# Patient Record
Sex: Male | Born: 1965 | Race: White | Hispanic: No | Marital: Married | Smoking: Current every day smoker
Health system: Southern US, Community
[De-identification: ages and names within clinical notes are randomized; demographics above are authoritative.]

## PROBLEM LIST (undated history)

## (undated) DIAGNOSIS — G4733 Obstructive sleep apnea (adult) (pediatric): Secondary | ICD-10-CM

## (undated) DIAGNOSIS — J449 Chronic obstructive pulmonary disease, unspecified: Secondary | ICD-10-CM

## (undated) DIAGNOSIS — G5600 Carpal tunnel syndrome, unspecified upper limb: Secondary | ICD-10-CM

## (undated) DIAGNOSIS — K219 Gastro-esophageal reflux disease without esophagitis: Secondary | ICD-10-CM

## (undated) DIAGNOSIS — F319 Bipolar disorder, unspecified: Secondary | ICD-10-CM

## (undated) DIAGNOSIS — M1990 Unspecified osteoarthritis, unspecified site: Secondary | ICD-10-CM

## (undated) DIAGNOSIS — I10 Essential (primary) hypertension: Secondary | ICD-10-CM

## (undated) DIAGNOSIS — F32A Depression, unspecified: Secondary | ICD-10-CM

## (undated) DIAGNOSIS — F419 Anxiety disorder, unspecified: Secondary | ICD-10-CM

## (undated) DIAGNOSIS — I2510 Atherosclerotic heart disease of native coronary artery without angina pectoris: Secondary | ICD-10-CM

## (undated) DIAGNOSIS — G8929 Other chronic pain: Secondary | ICD-10-CM

## (undated) HISTORY — DX: Obstructive sleep apnea (adult) (pediatric): G47.33

## (undated) HISTORY — PX: SHOULDER ARTHROSCOPY: SHX128

## (undated) HISTORY — DX: Atherosclerotic heart disease of native coronary artery without angina pectoris: I25.10

## (undated) HISTORY — DX: Anxiety disorder, unspecified: F41.9

## (undated) HISTORY — DX: Carpal tunnel syndrome, unspecified upper limb: G56.00

## (undated) HISTORY — PX: ANTERIOR FUSION CERVICAL SPINE: SUR626

## (undated) HISTORY — PX: BACK SURGERY: SHX140

## (undated) HISTORY — DX: Other chronic pain: G89.29

## (undated) HISTORY — PX: COLONOSCOPY W/ POLYPECTOMY: SHX1380

## (undated) HISTORY — PX: CARPAL TUNNEL RELEASE: SHX101

---

## 2006-06-11 ENCOUNTER — Ambulatory Visit (HOSPITAL_COMMUNITY): Admission: RE | Admit: 2006-06-11 | Discharge: 2006-06-12 | Payer: Self-pay | Admitting: Neurosurgery

## 2006-09-24 ENCOUNTER — Ambulatory Visit (HOSPITAL_COMMUNITY): Admission: RE | Admit: 2006-09-24 | Discharge: 2006-09-25 | Payer: Self-pay | Admitting: Neurosurgery

## 2007-08-15 ENCOUNTER — Encounter
Admission: RE | Admit: 2007-08-15 | Discharge: 2007-10-16 | Payer: Self-pay | Admitting: Physical Medicine & Rehabilitation

## 2007-08-16 ENCOUNTER — Ambulatory Visit: Payer: Self-pay | Admitting: Physical Medicine & Rehabilitation

## 2007-09-13 ENCOUNTER — Ambulatory Visit: Payer: Self-pay | Admitting: Physical Medicine & Rehabilitation

## 2007-10-16 ENCOUNTER — Ambulatory Visit: Payer: Self-pay | Admitting: Physical Medicine & Rehabilitation

## 2007-11-11 ENCOUNTER — Encounter
Admission: RE | Admit: 2007-11-11 | Discharge: 2008-02-09 | Payer: Self-pay | Admitting: Physical Medicine & Rehabilitation

## 2007-11-13 ENCOUNTER — Ambulatory Visit: Payer: Self-pay | Admitting: Physical Medicine & Rehabilitation

## 2007-12-10 ENCOUNTER — Ambulatory Visit: Payer: Self-pay | Admitting: Physical Medicine & Rehabilitation

## 2008-01-07 ENCOUNTER — Ambulatory Visit: Payer: Self-pay | Admitting: Physical Medicine & Rehabilitation

## 2008-02-06 ENCOUNTER — Encounter
Admission: RE | Admit: 2008-02-06 | Discharge: 2008-05-06 | Payer: Self-pay | Admitting: Physical Medicine & Rehabilitation

## 2008-02-07 ENCOUNTER — Ambulatory Visit: Payer: Self-pay | Admitting: Physical Medicine & Rehabilitation

## 2008-03-10 ENCOUNTER — Ambulatory Visit: Payer: Self-pay | Admitting: Physical Medicine & Rehabilitation

## 2008-04-10 ENCOUNTER — Ambulatory Visit: Payer: Self-pay | Admitting: Physical Medicine & Rehabilitation

## 2008-05-14 ENCOUNTER — Encounter: Admission: RE | Admit: 2008-05-14 | Discharge: 2008-05-18 | Payer: Self-pay | Admitting: Urology

## 2008-05-18 ENCOUNTER — Ambulatory Visit: Payer: Self-pay | Admitting: Physical Medicine & Rehabilitation

## 2008-06-11 IMAGING — CR DG CHEST 2V
2 series · 2 of 2 positions shown · non-contrast
Comparison: none

CLINICAL DATA: Preoperative respiratory exam for disc surgery. 
 CHEST - 2 VIEW:

[view not recorded (1 of 2)]
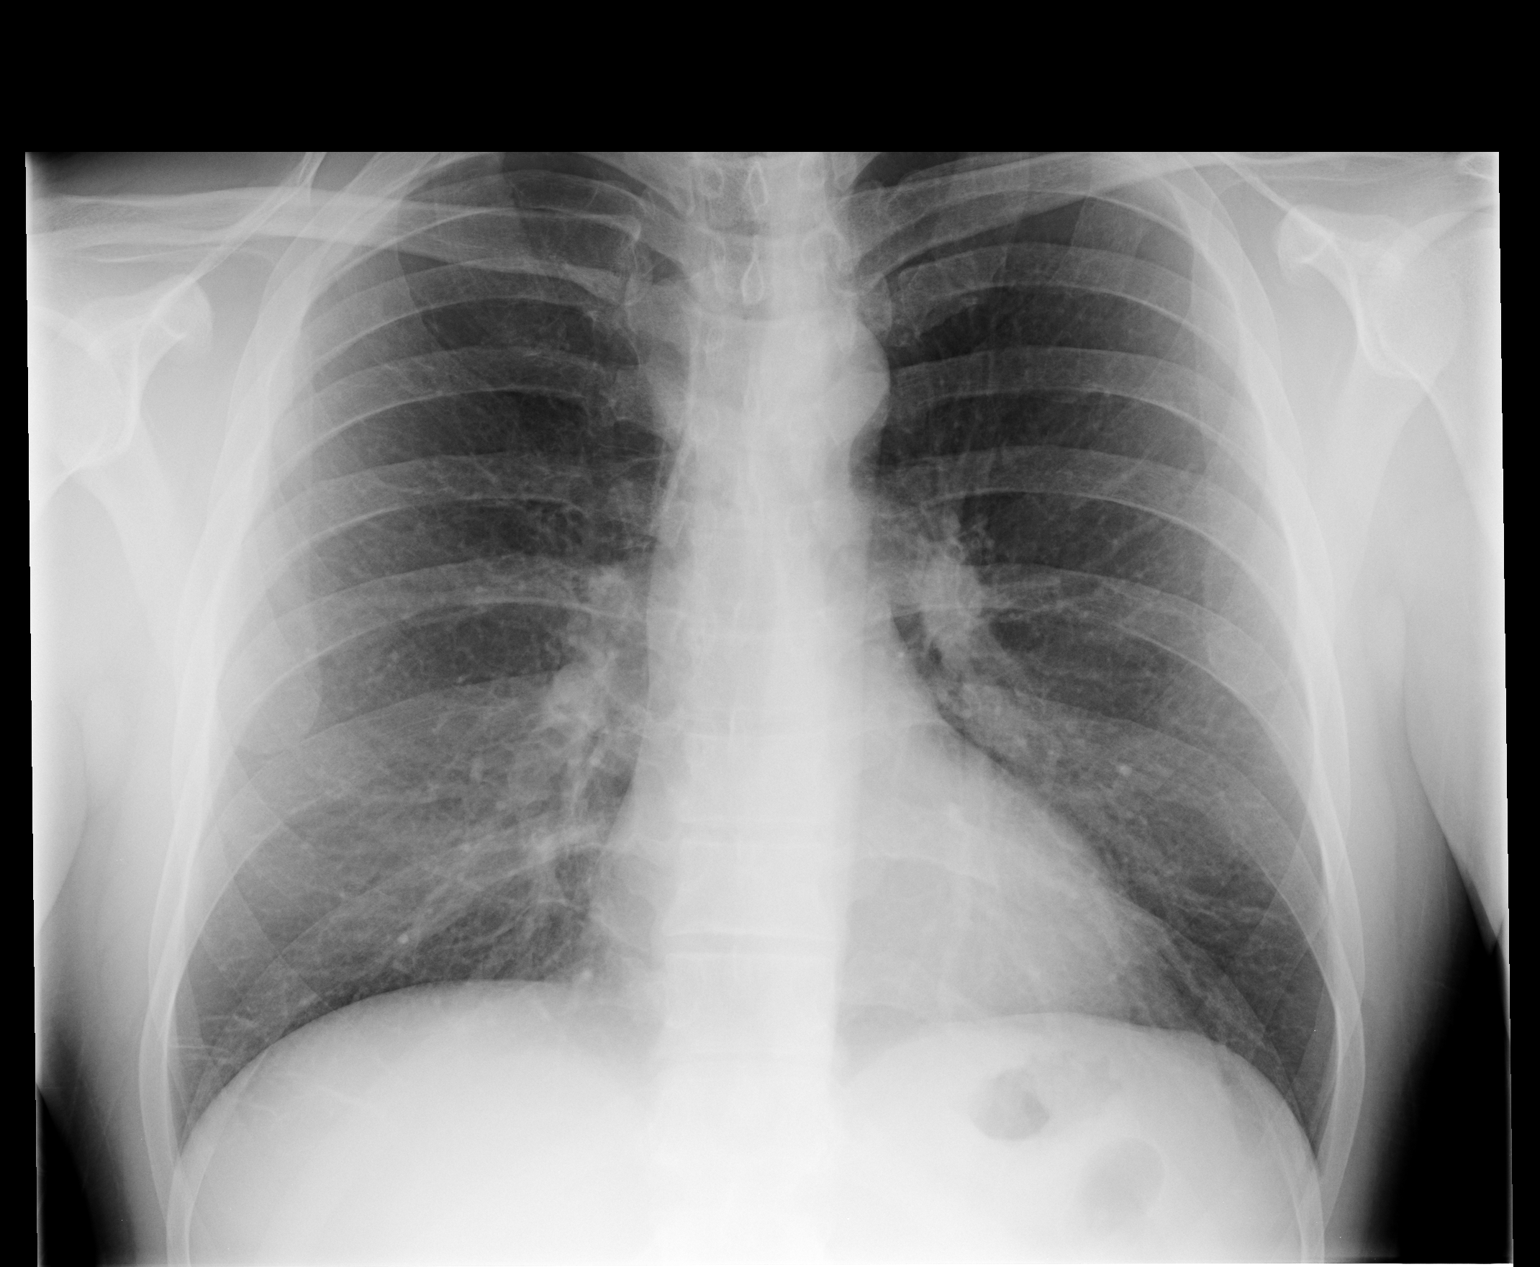

[view not recorded (2 of 2)]
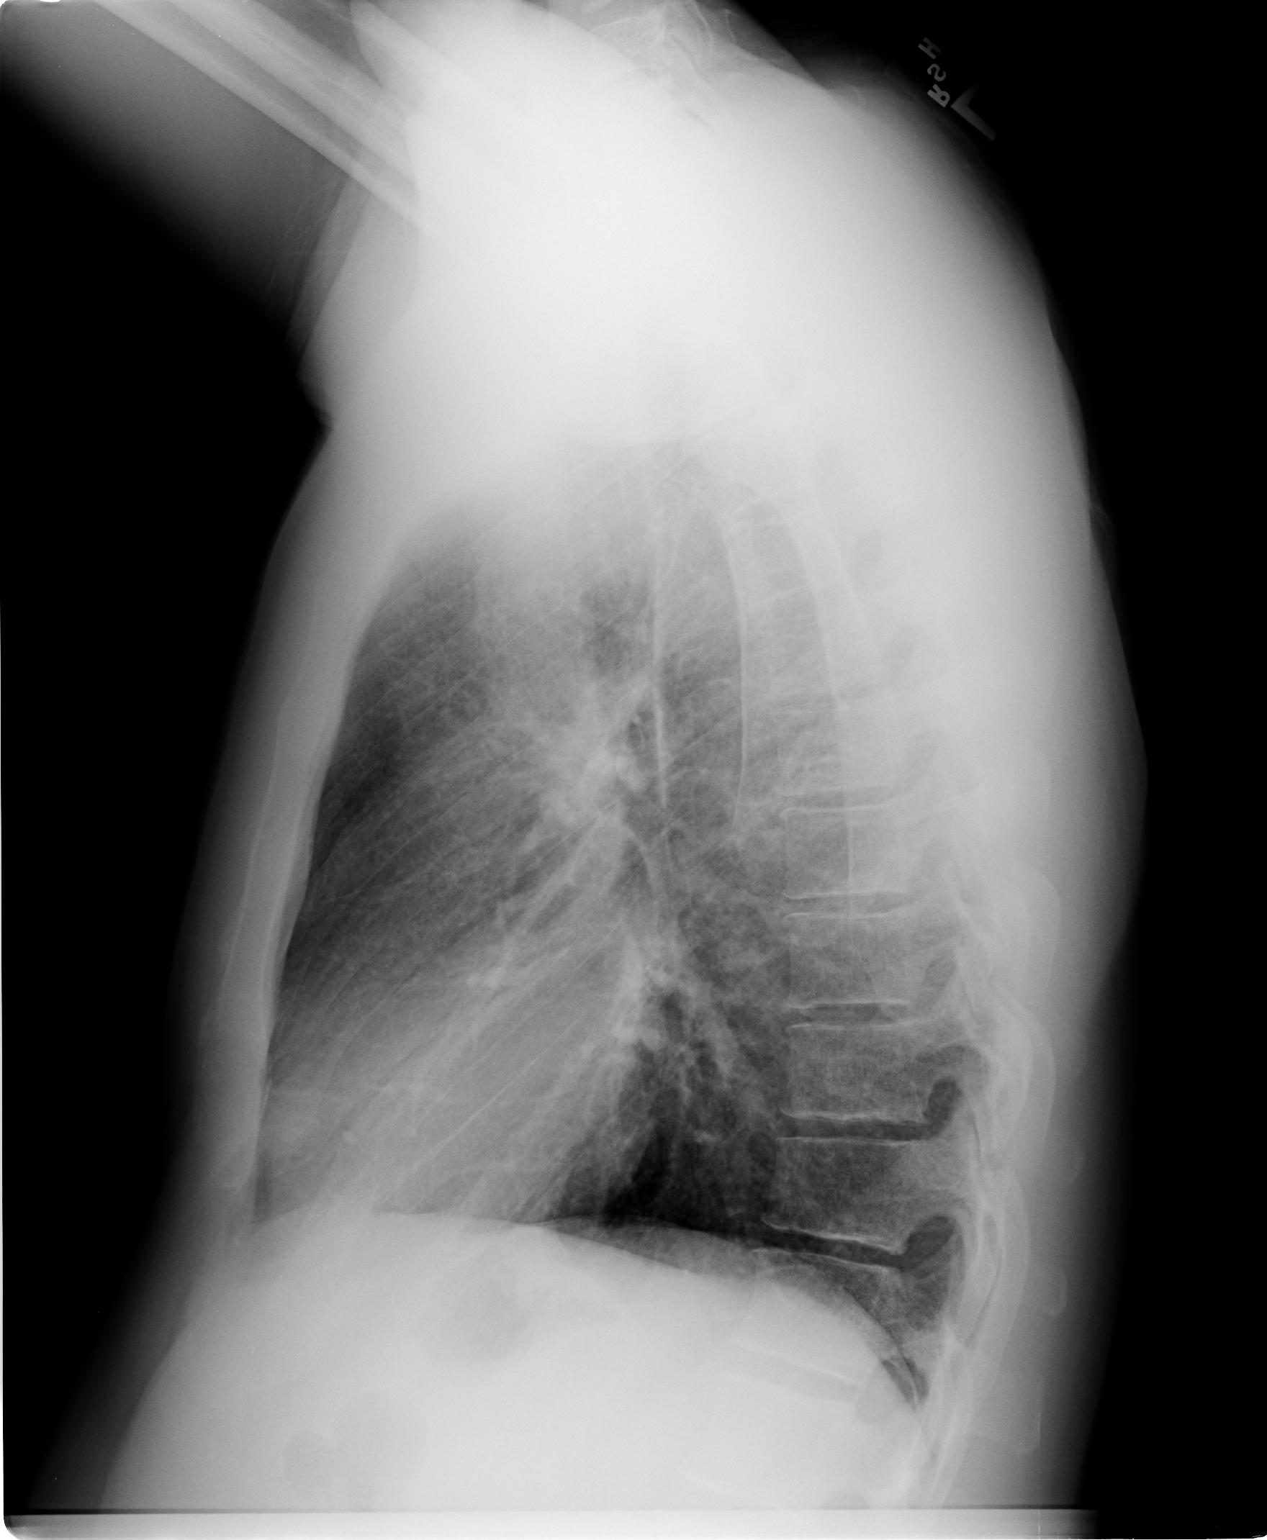

[2 of 2 positions shown; findings below may reference images not displayed]

FINDINGS: The heart and mediastinum are normal. There is mild scarring at the lung bases.  No evidence of active infiltrate, mass, effusion, or collapse.
IMPRESSION: Chronic lung markings without evidence of acute process.

## 2008-09-28 IMAGING — CR DG CERVICAL SPINE 2 OR 3 VIEWS
1 series · 1 of 1 positions shown · non-contrast
Comparison: none

CLINICAL DATA: 40 year old with cervical disk herniation.
 CERVICAL SPINE ? 2 VIEWS ? 09/24/06: 
 Two lateral cervical spine films from the operating room.

[view not recorded]
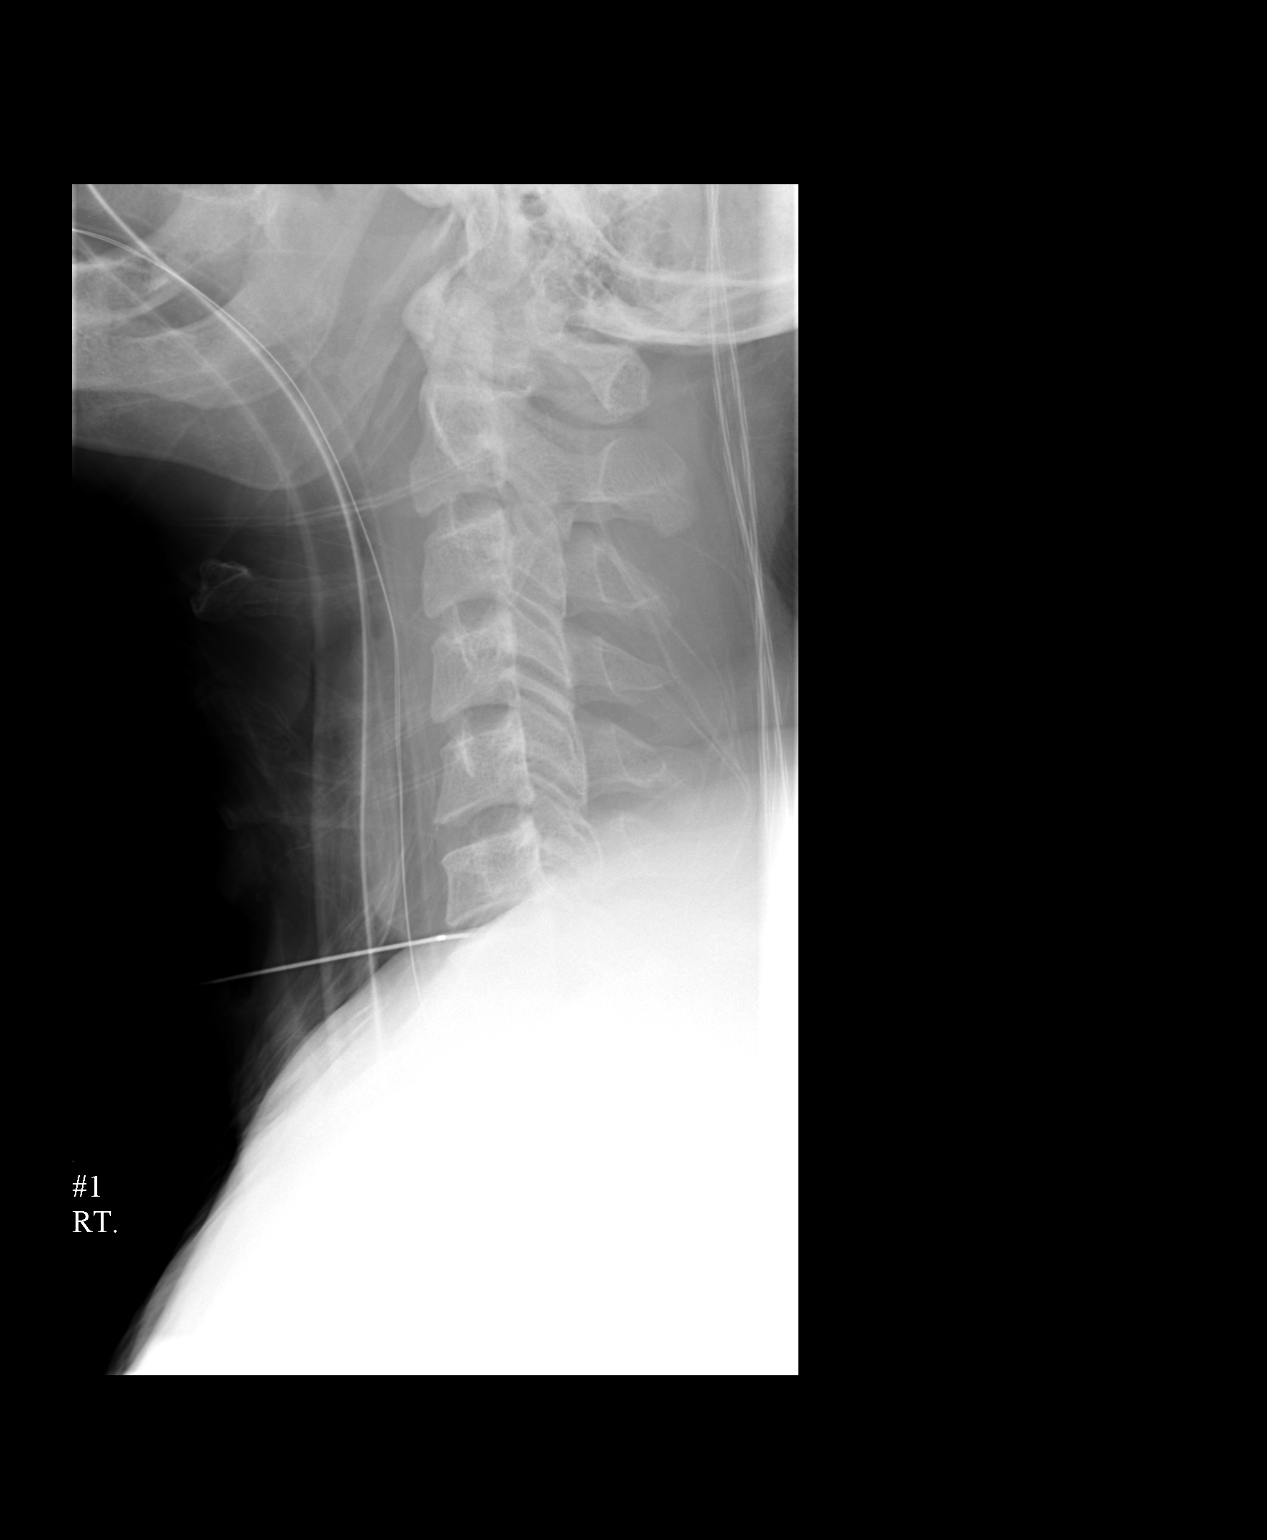

[1 of 1 positions shown; findings below may reference images not displayed]

FINDINGS: The first film demonstrates a spinal needle projecting at the C6-7 interspace.  The second film shows anterior plate and screws and an interbody bone plug fusing C6-7.  Normal alignment.  No complicating features.
IMPRESSION: C6-7 fusion.

## 2009-01-07 DIAGNOSIS — M961 Postlaminectomy syndrome, not elsewhere classified: Secondary | ICD-10-CM | POA: Insufficient documentation

## 2009-01-07 HISTORY — DX: Postlaminectomy syndrome, not elsewhere classified: M96.1

## 2009-02-10 DIAGNOSIS — M5126 Other intervertebral disc displacement, lumbar region: Secondary | ICD-10-CM

## 2009-02-10 DIAGNOSIS — M5412 Radiculopathy, cervical region: Secondary | ICD-10-CM

## 2009-02-10 HISTORY — DX: Other intervertebral disc displacement, lumbar region: M51.26

## 2009-02-10 HISTORY — DX: Radiculopathy, cervical region: M54.12

## 2009-06-03 DIAGNOSIS — R519 Headache, unspecified: Secondary | ICD-10-CM | POA: Insufficient documentation

## 2009-06-03 HISTORY — DX: Headache, unspecified: R51.9

## 2010-06-14 NOTE — Assessment & Plan Note (Signed)
Jimmy Morrison is back regarding his chronic cervicalgia and arm pain.  His pain  is 10/10.  We restarted Keppra and began Celebrex as well with Voltaren  gel.  He has had minimal results with these.  He is having a lot of  numbness and tingling in his hand along the thumb, index, and sometimes  in his middle finger.  I reviewed his MRI again today and it shows  stable postoperative site at C6-C7.  He does have a bulge at C5-C6.  There is no indication of any nerve root involvement on the scan.  EMG  reveals mild left C8 radiculopathy.  The patient states the pain is  constant and tingling.  He finds that the arm/hand goes numb when he is  sleeping, resting sometimes on the sides, sometimes even when he is on  his back.  Sleep overall is poor, going from 2-5 hours at night.  Elavil  helps him get to sleep but he cannot stay asleep.  Pain interferes with  general activity, relation with others, enjoyment of life on a moderate  to severe level.   REVIEW OF SYSTEMS:  Notable for numbness, tingling, spasms, depression,  history of suicidal thoughts, and night sweats.   SOCIAL HISTORY:  The patient is married.  No new issues are noted today.  He is living with his wife.  He states that he has been clean over the  last 2 months.  His urine drug screen was clean from September.   PHYSICAL EXAMINATION:  VITAL SIGNS:  Blood pressure is 129/82, pulse 84,  respiratory rate is 16, and he is sating 98% on room air.  GENERAL:  The patient is pleasant, alert and oriented x3.  He has had  positive Tinel sign today at the wrist, he had previously at the ulnar  grooves.  Spurling test was negative.  He had some tenderness of the  right shoulder with impingement maneuvers today that duplicated his  shoulder pain.  NECK:  Mildly tender to palpation in range.  Strength is generally 5/5  throughout except the hands which may have been 4+/5 with intrinsic  muscle use.  Reflexes are 1+ to 2+ throughout  inconsistently.  The  patient has mild tenderness with the L5-S1 level on palpation and with  reflexion today.  Cognitively, he is appropriate.  HEART:  Regular.  CHEST:  Clear.  ABDOMEN:  Soft and nontender.   ASSESSMENT:  1. Cervicalgia related to post laminectomy syndrome.  2. 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  3. Continued hand symptoms more on the C6-C7/median nerve distribution      of the hands.  I see no obvious ulnar nerve signs today which were      there in August.  MRI and nerve conduction study/EMG do not      corroborate with exam.  4. Depression.  5. History of cocaine abuse.   PLAN:  1. After informed consent, we injected the right shoulder via lateral      approach with 40 mg Kenalog at 3 mL, 1% lidocaine.  The patient      tolerated well.  Observed for relief of any of his right upper      extremity symptoms.  2. We will schedule another EMG nerve conduction study to assess for      carpal tunnel syndrome and C6-C7 radiculopathy.  3. Increase Keppra to 500 mg b.i.d.  4. We will add Ultram cautiously 50 mg q.i.d.  The patient will be  under close scrutiny regarding his narcotic      agreement and use of his medications.  5. I will see him back pending scheduling of the study above.      Ranelle Oyster, M.D.  Electronically Signed     ZTS/MedQ  D:  11/13/2007 10:39:36  T:  11/14/2007 02:17:12  Job #:  [id number]   cc:   Dyke Brackett, M.D.  Fax: [phone]

## 2010-06-14 NOTE — Assessment & Plan Note (Signed)
Jimmy Morrison is back regarding his chronic neck and shoulder pain.  His  anterior shoulder pain improved after the biceps injections, but still  he is having some intra-articular pain.  He complains of some tingling  down the right arm at times as well.  He rates his pain 9/10.  He  stopped the Topamax per his family physician's recommendations over with  concerns of seizures.  He had had some tingling in the ear and right  head that prompted this change.  He remains on Keppra 1000 mg b.i.d.  with Cymbalta 60 mg b.i.d.  Sleep is poor.  He has had some anxiety.   REVIEW OF SYSTEMS:  Notable for weakness, numbness, depression, spasms,  dizziness, night sweats, and poor appetite.  Full review is in the  written health and history section in the chart.   SOCIAL HISTORY:  The patient is married and no significant change is  noted.   PHYSICAL EXAMINATION:  VITAL SIGNS:  Blood pressure 113/56, pulse is 84,  and respiratory rate is 15.  He is sating 97% on room air.  GENERAL:  The patient is pleasant, alert and oriented x3.  EXTREMITIES:  He had the pain with rotator cuff impingement signs today.  Biceps tendons were minimally tender.  His strength 5/5 with 1+ reflexes  and normal sensation in both arms. NECK:  Painful with flexion more than  extension today and pain over the upper third of the trapezius with  palpation and reproduced back pain.  He had some discomfort with  rotation in either side as well.  Strength was 5/5 in both legs with  tight hamstring, is left greater than right.  He did a slump test.  His  straight leg raising was negative.  Cognitively, he is intact.  His mood  was euthymic.  CHEST:  Clear.  HEART:  Regular.  ABDOMEN:  Soft, nontender.   ASSESSMENT:  1. Cervicalgia related to post laminectomy syndrome.  2. 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  3. Radiating pain in the arms.  No clear evidence of new      radiculopathy, although he  has signs of chronic C8-T1 radiculopathy      in both nerve conduction/EMG studies.  4. Depression.  5. History of cocaine abuse.   PLAN:  1. After informed consent, we injected both shoulders via lateral      approach of 40 mg Kenalog and 3 mL of 1% lidocaine.  The patient      tolerated well.  2. Began low-dose MS Contin 15 mg q.12 h. with Vicodin 5/500 q.12 h.      p.r.n..  He is able to be on short release regarding his      medications and he has no room for air as far as we are concerned.      He is aware of this and states that we can depend on him.  3. Continue Keppra and Cymbalta 1000 b.i.d. and 60 b.i.d.      respectively.  4. Injected the right trapezius trigger point to 2 mL of 1% lidocaine.  5. We will see him back in a month.      Ranelle Oyster, M.D.  Electronically Signed    ZTS/MedQ  D:  02/07/2008 14:38:41  T:  02/08/2008 04:04:44  Job #:  [id number]

## 2010-06-14 NOTE — Assessment & Plan Note (Signed)
Jimmy Morrison is back regarding his chronic low back, neck, and shoulder pain.  He had great results with the bilateral shoulder injections a month ago.  He still has some numbness in the hands, tickly at nighttime that is  transient.  Low back is a problem with pain and tightness in the buttock  areas and posterior thigh.  MS Contin caused some headache initially but  the side effects have resolved.  Sleep is poor, however.  Denies pain  awaking him at nighttime.  He is on 200 mg trazodone per Dr. Welton Flakes  without benefit.  He rates his pain is 7/10 today.  He describes as  sharp, burning, stabbing, dull, constant, tingling.  Pain interferes  with general activity, relationship with others and enjoyment of life on  a moderate-to-severe level.  Sleep is poor.   REVIEW OF SYSTEMS:  Notable for the above as well as tingling,  depression, anxiety, suicidal thoughts in the past, weight gain, night  sweats, poor appetite, sleep apnea, wheezing.  Full review is in the  written health and history section of the chart.   SOCIAL HISTORY:  The patient is married and no changes noted there.   PHYSICAL EXAMINATION:  VITAL SIGNS:  Blood pressure 106/54, pulse 77,  respiratory rate 80, sating 97% on room air.  GENERAL:  The patient is pleasant, alert, and oriented x3.  He had less  pain with neck flexion and extension today.  Shoulders were less tender  with rotator cuff impingement maneuvers.  EXTREMITIES:  Strength is 5/5 in both upper and lower extremities today.  He had pain with seated slump test and hamstring tightness with forward  lumbar flexion.  He is able to bend to approximately 60 degrees today  with pain.  Rotation, lateral bending, as well as extension caused some  discomfort today also.  Sensory exam is grossly intact in the legs.  PELVIC:  No obvious pelvic asymmetry is noted.  He does have some  increased abdominal girth today.  HEART:  Regular.  CHEST:  Clear.  ABDOMEN:  Soft,  nontender.   ASSESSMENT:  1. Cervicalgia related to post laminectomy syndrome.  2. Low back pain related to spondylosis and post laminectomy syndrome.  3. Degenerative joint disease of the shoulders.  4. Chronic radiculopathy, bilateral upper extremities, in the C8-T1      distribution.  5. Depression.  6. History of cocaine abuse.   PLAN:  1. Send the patient for outpatient physical therapy to work on core      muscle and lower extremity strengthening, posture, lengthening, and      range of motion.  Modalities appropriate also.  TENS trial is in      order.  2. Increase MS Contin to 30 mg q.12 h. #60.  Continue Vicodin 5/500      one q.12 h. p.r.n. #60.  3. Continue Keppra and Cymbalta at 1000 b.i.d. and 60 b.i.d.      respectively.  4. Try Lunesta 3 mg at bedtime for sleep.  5. I will see him back in 2 months with Nurse Clinic followup in 1      month's time.  Check UDS at the MD visit in 2 months.      Ranelle Oyster, M.D.  Electronically Signed     ZTS/MedQ  D:  03/10/2008 12:31:49  T:  03/11/2008 02:20:30  Job #:  [id number]   cc:   Dyke Brackett, M.D.  Fax: [phone]

## 2010-06-14 NOTE — Op Note (Signed)
Jimmy Morrison, [name]:  [id number]   MEDICAL RECORD NO.:  [id number]          PATIENT TYPE:  OIB   LOCATION:  3172                         FACILITY:  [facility]   PHYSICIAN:  Clydene Fake, M.D.  DATE OF BIRTH:  February 22, 1965   DATE OF PROCEDURE:  09/24/2006  DATE OF DISCHARGE:                               OPERATIVE REPORT   PREOPERATIVE DIAGNOSIS:  Left C6-7.   POSTOPERATIVE DIAGNOSIS:  Left C6-7.   DESCRIPTION OF PROCEDURE:  1. Anterior cervical decompression and diskectomy and fusion at C6-7      with left allograft bone.  2. Anterior cervical plate.   SURGEON:  Clydene Fake, M.D.   ASSISTANT:  General endotracheal tube anesthesia.   ESTIMATED BLOOD LOSS:  Minimal.   BLOOD REPLACED:  None.   COMPLICATIONS:  None.   PROCEDURE:  The patient is a 45 year old gentleman with severe neck and  left arm pain and numbness and weakness in the left triceps and finger  extension.  MRIs done showing disk herniation, left-sided C6-7.  The  patient brought in for decompression and fusion.   PROCEDURE IN DETAIL:  The patient brought in the operating suite and  anesthesia was induced.  The patient was placed in 10 pounds halter  traction, prepped  and draped in a sterile fashion.  Incision was  injected with 9 mL of 1% lidocaine with epinephrine.  The incision was  then made from the midline to the anterior border of the  sternocleidomastoid muscle on the left side.  Neck incision taken down  to the platysma and hemostasis obtained with Bovie cauterization.  Platysma was incised with the Bovie and blunt dissection taken to the  anterior cervical fascia.  The anterior cervical spine needle was placed  in the interspace.  X-rays obtained showing where the 6-7.  Disk space  was incised with a 15-blade.  The needle was removed and partial  diskectomy performed with pituitary rongeurs.  Longus coli was reflected  laterally on each side using the Bovie and  self-retaining retractor was  placed.  Distraction pins placed in the C6 and C7 interspace.  Distracted anterior osteophytes removed with Kerrison punches and then a  diskectomy continued with pituitary rongeurs and curets.  Microscope was  brought in for microdissection and 1- and 2-mm Kerrison punches were  used to remove posterior disk osteophyte and ligaments and decompressing  the central canal and performing bilateral foraminotomies.  Rectus  fragments were removed from the left foramen, decompressing the left C7  root.  When we were finished, we had good decompression.  High-speed  drill was used to remove cartilaginous end plate.  Measured the height  of disk space to be 7 mm and a 7 mm left allograft bone was tapped into  place, countersunk couple millimeters.  We checked posterior to the bone  graft, and there was plenty of room between bone graft and dura,  checking with a nerve hook.  We irrigated with antibiotic solution and  good hemostasis.  Distraction and then distraction pins were removed.  Weight was removed from the traction.  The bone was firmly in place.  Gelfoam and thrombin was used for hemostasis.  __________  anterior  cervical plate was placed in the anterior cervical spine and two crews  placed in the C6 and two in the C7.  These were tightened down.  Lateral  x-rays obtained showing good position of  plates and screws and bone  plug at the 6-7 level.  __________  antibiotic solution after the  retractors were removed.  We had good hemostasis.  The platysma was  closed 3-0 Vicryl interrupted sutures.  Subcutaneous tissue closed with  same.  Skin closed with Benzoin and Steri-Strips.  Dressing was placed.  The patient was placed in a soft cervical collar, awakened from  anesthesia and transferred to the recovery room in stable condition.           ______________________________  Clydene Fake, M.D.     JRH/MEDQ  D:  09/24/2006  T:  09/24/2006  Job:   [id number]

## 2010-06-14 NOTE — Assessment & Plan Note (Signed)
Jimmy Morrison is back regarding his bilateral arm pain.  We performed an EMG  nerve conduction study last month, which was done on the right upper  extremity.  This was positive for right mild chronic C8-T1  radiculopathy.  This mirrored his findings of the studies done on his  left upper extremity.  He had good results with the right shoulder  injection a month prior, but seemed to be having similar pain returned  again.  His pain is 8/10 today and described his calves are tingling and  aching.  He is also having some pain in his legs now particularly when  he sits and walks for prolonged periods of time with some pain radiating  down the left leg.  Pain is interfering with general activity, relations  with others, and enjoyment of life on a moderate level.  Sleep is poor.  He takes Ultram 50 mg q.i.d. p.r.n. as well as Keppra 500 mg b.i.d.  He  took himself off the Wellbutrin as he was not feeling that it was  helping.  He is on Cymbalta 60 mg daily.   REVIEW OF SYSTEMS:  Notable for numbness, tingling, trouble walking,  spasms, weakness, occasional palpitation, depression, and decreased  mood.  He has had suicidal thoughts, but nothing consistent with carry  through with these.   SOCIAL HISTORY:  The patient is married.  There was no significant  change otherwise.   PHYSICAL EXAMINATION:  Blood pressure 108/63, pulse is 80, respiratory  rate is 68, and satting 98% on room air.  The patient is extremely pleasant, alert, and oriented x3. Affect is  generally bright and alert.  He has a normal gait.  Bilateral shoulders  are painful.  Rotator cuff impingement signs were more tender today,  really with biceps palpation and provocation.  He had strength at 5/5 in  both upper extremities with normal sensation. Lower extremity testing  was negative for seated slump test and straight leg raising.  Reflexes  were 1+.  Strength is 5/5 in both legs with normal sensory function.  Hamstrings were  tight, left greater than right.  Cognitively, he is  appropriate.  Heart is regular. Chest is clear.  Abdomen is soft and  nontender.   ASSESSMENT:  1. Cervicalgia related to the post-myomectomy syndrome.  2. 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  3. The patient with radiating pain into both arms, however, exam is      most consistent with biceps tendon and rotator cuff/shoulder      origins with his referred pain.  EMG was notable for a chronic mild      C8-T1 radiculopathy on the right and the same was found on the      left.  4. Depression.  5. History of cocaine abuse.   PLAN:  1. After informed consent, we injected both short and long biceps      tendons on the right to the origins as well as the short biceps      tendon on the left using 40 mg of Kenalog and 3 mL 1% Lidocaine.      The patient tolerated well.  2. Increase Keppra to 1000 mg b.i.d. for any radiating neurogenic      pain.  I think his lower extremity pain is more positional and      related to tight musculature than anything else.  3. We will increase Cymbalta to 60 mg two tablets once a day.  4. Ultram  for breakthrough pain.  5. I will see him back in about a month's time.      Ranelle Oyster, M.D.  Electronically Signed     ZTS/MedQ  D:  01/07/2008 11:05:31  T:  01/07/2008 23:35:52  Job #:  [id number]

## 2010-06-14 NOTE — Assessment & Plan Note (Signed)
[name] is back regarding his cervicalgia and shoulder pain.  He  essentially stopped Cymbalta, Elavil, and Keppra as he was afraid of  sedating side effects.  In fact he never started the Keppra.  His urine  drug screen came back positive for cocaine.  The patient admitted to  taking cocaine.  He says that was a mistake.  He is rating his pain a  8/10.  The pain is sharp, constant, and tingling.  Pain interferes with  general activity, relationship with others, enjoyment of life on a  moderate level.  Sleep is poor.  He is having a light tingling in his  hands at night particularly over the fourth and fifth fingers when he  bends his arms.  Pain in the neck is notable when he is standing or  bending for long as well as sitting for longer periods.   REVIEW OF SYSTEMS:  Notable for numbness, tingling, spasms, dizziness,  depression, loss of taste, night sweats, poor appetite, shortness of  breath, sleep apnea, and wheezing.  Full review is written out in the  history section of the chart.   SOCIAL HISTORY:  As noted above.   PHYSICAL EXAMINATION:  VITAL SIGNS:  Blood pressure 119/71, pulse is 56,  respiratory rate 18, and he is sating 98% on room air.  GENERAL:  Generally pleasant in no acute distress.  He is alert and  oriented x3.  He had positive Tinel signs at both ulnar grooves.  Wrist  were also clinically positive.  Spurling test was negative.  The patient  did have extension tenderness in the neck and pain along the facets and  tranverse processes of the cervical spinal segments.  The myofascial  pain was appreciated as well.  The right shoulder was tender with  rotational movements as well as abduction.  Strength was 5/5.  I saw no  obvious sensory loss.  Reflexes were 1+ or 2+ throughout.  The patient  had some mild back pain at the L5-S1 level and noted the surgical site  there.  Cognitively he is appropriate.  HEART:  Regular rhythm slightly bradycardic.  CHEST:   Clear.   ASSESSMENT:  1. Persistent cervicalgia likely related to post laminectomy syndrome.  2. Right arm pain related to degenerative joint disease of the      shoulder.  3. Tingling in the arms and hands which seems to be more related to a      peripheral ulnar nerve compression at the elbow.  I have a partial      report from a nerve conduction EMG done in May.  I see no obvious      correlation of the study and the cervical MRI which indicates no C8      distribution nerve root involvement.  4. Depression.  5. Cocaine abuse.   PLAN:  1. I told the patient I will not prescribe any narcotics at this point      due to his drug history.  2. We will begin the Keppra again as well as the trial Celebrex and      Voltaren gel.  We will use the Voltaren gel b.i.d. for the neck and      Celebrex will be used once daily by mouth.  If he tests positive on      another urine screen we will discharge him from the clinic as      anticonvulsants and other neuro active agents are potentially  lethal when mixed with illicit drugs.  3. I again, need the copies of Dr. Doreen Beam treatment for low back.  4. We will recommend an elbow pad and extension of the elbows for now.      If symptoms should progress, I      would recommend another nerve conduction study.  5. We will see him back in about 1 month's time.  We will collect UDS      at that point.      Jimmy Morrison, M.D.  Electronically Signed     ZTS/MedQ  D:  09/13/2007 11:44:38  T:  09/14/2007 02:47:24  Job #:  [id number]   cc:   Dyke Brackett, M.D.  Fax: [phone]

## 2010-06-14 NOTE — Assessment & Plan Note (Signed)
HISTORY:  Jimmy Morrison is back regarding his chronic back, neck, and shoulder  pain.  He is complaining of right heel pain for the last month.  He  feels that it is gout.  His pain today is 7-8/10.  He feels that the  shoulder and back symptoms are under fair control with his current  regimen of medications and activities.  Pain interferes with general  activities, relations with others, enjoyment of life on a moderate-to-  severe level.  Sleep is poor at times.   REVIEW OF SYSTEMS:  Notable for suicidal thoughts in the past,  depression, decreased taste, numbness, poor appetite, night sweats.  Other pertinent positives are above.  Full review is in the written  health and history section of the chart.   SOCIAL HISTORY:  The patient is married.   PHYSICAL EXAMINATION:  VITAL SIGNS:  Blood pressure is 146/83, pulse 75,  respiratory rate 18, and he is sating 96% on room air.  GENERAL:  The patient is pleasant, alert, oriented x3.  He is bit  antalgic on the right foot today.  HEART:  Regular.  CHEST:  Clear.  ABDOMEN:  Soft and nontender.  EXTREMITIES:  He has familiar areas of pain in the upper and low back as  well as the right shoulder.  He had pain and tenderness with forward  flexion at the lumbar spine with the tightness in the hamstrings  particularly.  Right foot was noticeably tender along the heel, but no  discoloration, redness, swelling or anything of that nature was noted.  I could not palpate any focal masses.  Cognitively, he is intact.   ASSESSMENT:  1. Cervicalgia related to post laminectomy syndrome.  2. Lumbar spondylosis and post laminectomy syndrome.  3. Degenerative joint disease at the shoulders.  4. Chronic upper extremity radiculopathy in C8-T1 distribution.  5. Depression.  6. History of cocaine abuse.  7. Likely right heel spur.   PLAN:  1. Recommendations for right heel including heel cushioning, ice,      Voltaren gel, new shoes.  He also continue the  Celebrex.  If he      does not subside we can look at injection.  2. Continue to use TENS unit for back.  3. I will refill MS Contin 30 mg q.12 h., #60.  4. Vicodin 5/500, one q.12 h. P.r.n., #60.  5. Continue Keppra and Cymbalta at 1000 b.i.d. and 60 b.i.d.      respectively.  6. The patient had trazodone prescribed by another physician, we will      follow that.  I will see him back in 3 months with nursing followup      in 1 month.  The patient had a UDS collected today.      Ranelle Oyster, M.D.  Electronically Signed     ZTS/MedQ  D:  05/18/2008 12:07:32  T:  05/19/2008 03:35:36  Job #:  [id number]   cc:   Dyke Brackett, M.D.  Fax: [phone]

## 2010-10-01 HISTORY — PX: LUMBAR NERVE STIMLATOR INSERTION: SHX1983

## 2010-11-11 LAB — BASIC METABOLIC PANEL
BUN: 7
CO2: 25
Calcium: 9.6
Chloride: 103
Creatinine, Ser: 0.94
GFR calc Af Amer: >60
GFR calc non Af Amer: >60
Glucose, Bld: 101 — ABNORMAL HIGH
Potassium: 4
Sodium: 136

## 2010-11-11 LAB — CBC
HCT: 46.3
Hemoglobin: 15.9
MCHC: 34.4
MCV: 94
Platelets: 282
RBC: 4.93
RDW: 15.1 — ABNORMAL HIGH
WBC: 15.6 — ABNORMAL HIGH

## 2010-11-11 LAB — URINALYSIS, ROUTINE W REFLEX MICROSCOPIC
Bilirubin Urine: NEGATIVE
Glucose, UA: NEGATIVE
Hgb urine dipstick: NEGATIVE
Ketones, ur: NEGATIVE
Nitrite: NEGATIVE
Protein, ur: NEGATIVE
Specific Gravity, Urine: 1.016
Urobilinogen, UA: 1
pH: 6

## 2010-11-11 LAB — PROTIME-INR
INR: 0.9
Prothrombin Time: 12.1

## 2010-11-11 LAB — APTT: aPTT: 34

## 2012-01-22 DIAGNOSIS — M961 Postlaminectomy syndrome, not elsewhere classified: Secondary | ICD-10-CM | POA: Insufficient documentation

## 2012-01-22 DIAGNOSIS — M5030 Other cervical disc degeneration, unspecified cervical region: Secondary | ICD-10-CM | POA: Insufficient documentation

## 2012-01-22 DIAGNOSIS — E669 Obesity, unspecified: Secondary | ICD-10-CM | POA: Insufficient documentation

## 2012-01-22 DIAGNOSIS — E66811 Obesity, class 1: Secondary | ICD-10-CM

## 2012-01-22 DIAGNOSIS — M51369 Other intervertebral disc degeneration, lumbar region without mention of lumbar back pain or lower extremity pain: Secondary | ICD-10-CM | POA: Insufficient documentation

## 2012-01-22 DIAGNOSIS — M5136 Other intervertebral disc degeneration, lumbar region: Secondary | ICD-10-CM

## 2012-01-22 HISTORY — DX: Other intervertebral disc degeneration, lumbar region: M51.36

## 2012-01-22 HISTORY — DX: Postlaminectomy syndrome, not elsewhere classified: M96.1

## 2012-01-22 HISTORY — DX: Obesity, unspecified: E66.9

## 2012-01-22 HISTORY — DX: Other intervertebral disc degeneration, lumbar region without mention of lumbar back pain or lower extremity pain: M51.369

## 2012-01-22 HISTORY — DX: Obesity, class 1: E66.811

## 2012-01-22 HISTORY — DX: Other cervical disc degeneration, unspecified cervical region: M50.30

## 2012-02-22 DIAGNOSIS — G894 Chronic pain syndrome: Secondary | ICD-10-CM | POA: Insufficient documentation

## 2012-02-22 HISTORY — DX: Chronic pain syndrome: G89.4

## 2014-02-19 DIAGNOSIS — M4322 Fusion of spine, cervical region: Secondary | ICD-10-CM | POA: Diagnosis not present

## 2014-02-19 DIAGNOSIS — M5116 Intervertebral disc disorders with radiculopathy, lumbar region: Secondary | ICD-10-CM | POA: Diagnosis not present

## 2014-02-19 DIAGNOSIS — Z9889 Other specified postprocedural states: Secondary | ICD-10-CM | POA: Diagnosis not present

## 2014-02-19 DIAGNOSIS — M961 Postlaminectomy syndrome, not elsewhere classified: Secondary | ICD-10-CM | POA: Diagnosis not present

## 2014-02-19 DIAGNOSIS — G894 Chronic pain syndrome: Secondary | ICD-10-CM | POA: Diagnosis not present

## 2014-03-04 DIAGNOSIS — G4733 Obstructive sleep apnea (adult) (pediatric): Secondary | ICD-10-CM | POA: Diagnosis not present

## 2014-03-06 DIAGNOSIS — G4733 Obstructive sleep apnea (adult) (pediatric): Secondary | ICD-10-CM | POA: Diagnosis not present

## 2014-03-13 DIAGNOSIS — M961 Postlaminectomy syndrome, not elsewhere classified: Secondary | ICD-10-CM | POA: Diagnosis not present

## 2014-03-13 DIAGNOSIS — M5116 Intervertebral disc disorders with radiculopathy, lumbar region: Secondary | ICD-10-CM | POA: Diagnosis not present

## 2014-03-13 DIAGNOSIS — G894 Chronic pain syndrome: Secondary | ICD-10-CM | POA: Diagnosis not present

## 2014-03-13 DIAGNOSIS — Z9889 Other specified postprocedural states: Secondary | ICD-10-CM | POA: Diagnosis not present

## 2014-03-13 DIAGNOSIS — M4322 Fusion of spine, cervical region: Secondary | ICD-10-CM | POA: Diagnosis not present

## 2014-04-02 DIAGNOSIS — G4733 Obstructive sleep apnea (adult) (pediatric): Secondary | ICD-10-CM | POA: Diagnosis not present

## 2014-04-09 DIAGNOSIS — M5116 Intervertebral disc disorders with radiculopathy, lumbar region: Secondary | ICD-10-CM | POA: Diagnosis not present

## 2014-04-09 DIAGNOSIS — Z9889 Other specified postprocedural states: Secondary | ICD-10-CM | POA: Diagnosis not present

## 2014-04-09 DIAGNOSIS — G894 Chronic pain syndrome: Secondary | ICD-10-CM | POA: Diagnosis not present

## 2014-04-09 DIAGNOSIS — M4322 Fusion of spine, cervical region: Secondary | ICD-10-CM | POA: Diagnosis not present

## 2014-04-09 DIAGNOSIS — M961 Postlaminectomy syndrome, not elsewhere classified: Secondary | ICD-10-CM | POA: Diagnosis not present

## 2014-05-03 DIAGNOSIS — G4733 Obstructive sleep apnea (adult) (pediatric): Secondary | ICD-10-CM | POA: Diagnosis not present

## 2014-05-25 DIAGNOSIS — I10 Essential (primary) hypertension: Secondary | ICD-10-CM | POA: Diagnosis not present

## 2014-05-25 DIAGNOSIS — G8929 Other chronic pain: Secondary | ICD-10-CM | POA: Diagnosis not present

## 2014-06-02 DIAGNOSIS — L720 Epidermal cyst: Secondary | ICD-10-CM | POA: Diagnosis not present

## 2014-06-02 DIAGNOSIS — G4733 Obstructive sleep apnea (adult) (pediatric): Secondary | ICD-10-CM | POA: Diagnosis not present

## 2014-07-03 DIAGNOSIS — G4733 Obstructive sleep apnea (adult) (pediatric): Secondary | ICD-10-CM | POA: Diagnosis not present

## 2014-07-16 DIAGNOSIS — G4733 Obstructive sleep apnea (adult) (pediatric): Secondary | ICD-10-CM | POA: Diagnosis not present

## 2014-08-02 DIAGNOSIS — G4733 Obstructive sleep apnea (adult) (pediatric): Secondary | ICD-10-CM | POA: Diagnosis not present

## 2014-09-02 DIAGNOSIS — G4733 Obstructive sleep apnea (adult) (pediatric): Secondary | ICD-10-CM | POA: Diagnosis not present

## 2014-10-03 DIAGNOSIS — G4733 Obstructive sleep apnea (adult) (pediatric): Secondary | ICD-10-CM | POA: Diagnosis not present

## 2014-10-29 DIAGNOSIS — G4733 Obstructive sleep apnea (adult) (pediatric): Secondary | ICD-10-CM | POA: Diagnosis not present

## 2014-11-02 DIAGNOSIS — G4733 Obstructive sleep apnea (adult) (pediatric): Secondary | ICD-10-CM | POA: Diagnosis not present

## 2014-12-03 DIAGNOSIS — G4733 Obstructive sleep apnea (adult) (pediatric): Secondary | ICD-10-CM | POA: Diagnosis not present

## 2015-01-02 DIAGNOSIS — G4733 Obstructive sleep apnea (adult) (pediatric): Secondary | ICD-10-CM | POA: Diagnosis not present

## 2015-01-25 DIAGNOSIS — J209 Acute bronchitis, unspecified: Secondary | ICD-10-CM | POA: Diagnosis not present

## 2015-01-25 DIAGNOSIS — J0100 Acute maxillary sinusitis, unspecified: Secondary | ICD-10-CM | POA: Diagnosis not present

## 2015-02-04 DIAGNOSIS — K219 Gastro-esophageal reflux disease without esophagitis: Secondary | ICD-10-CM | POA: Diagnosis not present

## 2015-02-04 DIAGNOSIS — Z23 Encounter for immunization: Secondary | ICD-10-CM | POA: Diagnosis not present

## 2015-02-04 DIAGNOSIS — G4700 Insomnia, unspecified: Secondary | ICD-10-CM | POA: Diagnosis not present

## 2015-02-04 DIAGNOSIS — E782 Mixed hyperlipidemia: Secondary | ICD-10-CM | POA: Diagnosis not present

## 2015-02-04 DIAGNOSIS — G8929 Other chronic pain: Secondary | ICD-10-CM | POA: Diagnosis not present

## 2015-02-04 DIAGNOSIS — Z79899 Other long term (current) drug therapy: Secondary | ICD-10-CM | POA: Diagnosis not present

## 2015-02-04 DIAGNOSIS — R58 Hemorrhage, not elsewhere classified: Secondary | ICD-10-CM | POA: Diagnosis not present

## 2015-02-04 DIAGNOSIS — I10 Essential (primary) hypertension: Secondary | ICD-10-CM | POA: Diagnosis not present

## 2015-02-04 DIAGNOSIS — J310 Chronic rhinitis: Secondary | ICD-10-CM | POA: Diagnosis not present

## 2015-02-11 DIAGNOSIS — M5116 Intervertebral disc disorders with radiculopathy, lumbar region: Secondary | ICD-10-CM | POA: Diagnosis not present

## 2015-02-11 DIAGNOSIS — M961 Postlaminectomy syndrome, not elsewhere classified: Secondary | ICD-10-CM | POA: Diagnosis not present

## 2015-02-11 DIAGNOSIS — M5136 Other intervertebral disc degeneration, lumbar region: Secondary | ICD-10-CM | POA: Diagnosis not present

## 2015-02-11 DIAGNOSIS — M5090 Cervical disc disorder, unspecified, unspecified cervical region: Secondary | ICD-10-CM | POA: Diagnosis not present

## 2015-02-11 DIAGNOSIS — M4322 Fusion of spine, cervical region: Secondary | ICD-10-CM | POA: Diagnosis not present

## 2015-02-11 DIAGNOSIS — G894 Chronic pain syndrome: Secondary | ICD-10-CM | POA: Diagnosis not present

## 2015-03-11 DIAGNOSIS — G894 Chronic pain syndrome: Secondary | ICD-10-CM | POA: Diagnosis not present

## 2015-03-11 DIAGNOSIS — M5116 Intervertebral disc disorders with radiculopathy, lumbar region: Secondary | ICD-10-CM | POA: Diagnosis not present

## 2015-03-11 DIAGNOSIS — M5136 Other intervertebral disc degeneration, lumbar region: Secondary | ICD-10-CM | POA: Diagnosis not present

## 2015-03-11 DIAGNOSIS — M5090 Cervical disc disorder, unspecified, unspecified cervical region: Secondary | ICD-10-CM | POA: Diagnosis not present

## 2015-03-11 DIAGNOSIS — M4322 Fusion of spine, cervical region: Secondary | ICD-10-CM | POA: Diagnosis not present

## 2015-03-11 DIAGNOSIS — M961 Postlaminectomy syndrome, not elsewhere classified: Secondary | ICD-10-CM | POA: Diagnosis not present

## 2015-05-06 ENCOUNTER — Encounter: Payer: Self-pay | Admitting: Physical Medicine & Rehabilitation

## 2015-10-06 DIAGNOSIS — K219 Gastro-esophageal reflux disease without esophagitis: Secondary | ICD-10-CM | POA: Diagnosis not present

## 2015-10-06 DIAGNOSIS — Z79899 Other long term (current) drug therapy: Secondary | ICD-10-CM | POA: Diagnosis not present

## 2015-10-06 DIAGNOSIS — G8929 Other chronic pain: Secondary | ICD-10-CM | POA: Diagnosis not present

## 2015-10-06 DIAGNOSIS — Z1389 Encounter for screening for other disorder: Secondary | ICD-10-CM | POA: Diagnosis not present

## 2015-10-06 DIAGNOSIS — F17200 Nicotine dependence, unspecified, uncomplicated: Secondary | ICD-10-CM | POA: Diagnosis not present

## 2015-10-06 DIAGNOSIS — K648 Other hemorrhoids: Secondary | ICD-10-CM | POA: Diagnosis not present

## 2015-10-26 DIAGNOSIS — K644 Residual hemorrhoidal skin tags: Secondary | ICD-10-CM | POA: Diagnosis not present

## 2015-10-26 DIAGNOSIS — F17200 Nicotine dependence, unspecified, uncomplicated: Secondary | ICD-10-CM | POA: Diagnosis not present

## 2016-03-30 DIAGNOSIS — M25511 Pain in right shoulder: Secondary | ICD-10-CM | POA: Diagnosis not present

## 2016-11-13 DIAGNOSIS — T07XXXA Unspecified multiple injuries, initial encounter: Secondary | ICD-10-CM | POA: Diagnosis not present

## 2016-11-13 DIAGNOSIS — W57XXXA Bitten or stung by nonvenomous insect and other nonvenomous arthropods, initial encounter: Secondary | ICD-10-CM | POA: Diagnosis not present

## 2016-11-13 DIAGNOSIS — Z720 Tobacco use: Secondary | ICD-10-CM | POA: Diagnosis not present

## 2016-11-13 DIAGNOSIS — Z1339 Encounter for screening examination for other mental health and behavioral disorders: Secondary | ICD-10-CM | POA: Diagnosis not present

## 2016-11-13 DIAGNOSIS — L089 Local infection of the skin and subcutaneous tissue, unspecified: Secondary | ICD-10-CM | POA: Diagnosis not present

## 2016-11-13 DIAGNOSIS — Z6829 Body mass index (BMI) 29.0-29.9, adult: Secondary | ICD-10-CM | POA: Diagnosis not present

## 2017-12-14 DIAGNOSIS — L304 Erythema intertrigo: Secondary | ICD-10-CM | POA: Diagnosis not present

## 2017-12-14 DIAGNOSIS — Z79899 Other long term (current) drug therapy: Secondary | ICD-10-CM | POA: Diagnosis not present

## 2017-12-14 DIAGNOSIS — I10 Essential (primary) hypertension: Secondary | ICD-10-CM | POA: Diagnosis not present

## 2017-12-14 DIAGNOSIS — Z1339 Encounter for screening examination for other mental health and behavioral disorders: Secondary | ICD-10-CM | POA: Diagnosis not present

## 2017-12-14 DIAGNOSIS — F419 Anxiety disorder, unspecified: Secondary | ICD-10-CM | POA: Diagnosis not present

## 2017-12-14 DIAGNOSIS — Z6831 Body mass index (BMI) 31.0-31.9, adult: Secondary | ICD-10-CM | POA: Diagnosis not present

## 2017-12-14 DIAGNOSIS — Z1331 Encounter for screening for depression: Secondary | ICD-10-CM | POA: Diagnosis not present

## 2017-12-14 DIAGNOSIS — K219 Gastro-esophageal reflux disease without esophagitis: Secondary | ICD-10-CM | POA: Diagnosis not present

## 2017-12-14 DIAGNOSIS — Z0000 Encounter for general adult medical examination without abnormal findings: Secondary | ICD-10-CM | POA: Diagnosis not present

## 2017-12-14 DIAGNOSIS — M961 Postlaminectomy syndrome, not elsewhere classified: Secondary | ICD-10-CM | POA: Diagnosis not present

## 2017-12-14 DIAGNOSIS — E782 Mixed hyperlipidemia: Secondary | ICD-10-CM | POA: Diagnosis not present

## 2017-12-29 DIAGNOSIS — K219 Gastro-esophageal reflux disease without esophagitis: Secondary | ICD-10-CM | POA: Diagnosis not present

## 2017-12-29 DIAGNOSIS — F419 Anxiety disorder, unspecified: Secondary | ICD-10-CM | POA: Diagnosis not present

## 2017-12-29 DIAGNOSIS — I10 Essential (primary) hypertension: Secondary | ICD-10-CM | POA: Diagnosis not present

## 2017-12-29 DIAGNOSIS — E782 Mixed hyperlipidemia: Secondary | ICD-10-CM | POA: Diagnosis not present

## 2018-01-28 DIAGNOSIS — E782 Mixed hyperlipidemia: Secondary | ICD-10-CM | POA: Diagnosis not present

## 2018-01-28 DIAGNOSIS — Z6831 Body mass index (BMI) 31.0-31.9, adult: Secondary | ICD-10-CM | POA: Diagnosis not present

## 2018-01-28 DIAGNOSIS — Z79899 Other long term (current) drug therapy: Secondary | ICD-10-CM | POA: Diagnosis not present

## 2018-01-28 DIAGNOSIS — G4730 Sleep apnea, unspecified: Secondary | ICD-10-CM | POA: Diagnosis not present

## 2018-01-28 DIAGNOSIS — Z1211 Encounter for screening for malignant neoplasm of colon: Secondary | ICD-10-CM | POA: Diagnosis not present

## 2018-01-28 DIAGNOSIS — Z720 Tobacco use: Secondary | ICD-10-CM | POA: Diagnosis not present

## 2018-01-29 DIAGNOSIS — I10 Essential (primary) hypertension: Secondary | ICD-10-CM | POA: Diagnosis not present

## 2018-01-29 DIAGNOSIS — G8929 Other chronic pain: Secondary | ICD-10-CM | POA: Diagnosis not present

## 2018-01-29 DIAGNOSIS — E782 Mixed hyperlipidemia: Secondary | ICD-10-CM | POA: Diagnosis not present

## 2018-02-28 DIAGNOSIS — K219 Gastro-esophageal reflux disease without esophagitis: Secondary | ICD-10-CM | POA: Diagnosis not present

## 2018-02-28 DIAGNOSIS — E782 Mixed hyperlipidemia: Secondary | ICD-10-CM | POA: Diagnosis not present

## 2018-02-28 DIAGNOSIS — I10 Essential (primary) hypertension: Secondary | ICD-10-CM | POA: Diagnosis not present

## 2018-03-13 DIAGNOSIS — D125 Benign neoplasm of sigmoid colon: Secondary | ICD-10-CM | POA: Diagnosis not present

## 2018-03-13 DIAGNOSIS — Z1211 Encounter for screening for malignant neoplasm of colon: Secondary | ICD-10-CM | POA: Diagnosis not present

## 2018-03-13 DIAGNOSIS — D123 Benign neoplasm of transverse colon: Secondary | ICD-10-CM | POA: Diagnosis not present

## 2018-03-13 DIAGNOSIS — D120 Benign neoplasm of cecum: Secondary | ICD-10-CM | POA: Diagnosis not present

## 2018-03-13 DIAGNOSIS — K635 Polyp of colon: Secondary | ICD-10-CM | POA: Diagnosis not present

## 2018-03-13 DIAGNOSIS — D126 Benign neoplasm of colon, unspecified: Secondary | ICD-10-CM | POA: Diagnosis not present

## 2018-03-30 DIAGNOSIS — I10 Essential (primary) hypertension: Secondary | ICD-10-CM | POA: Diagnosis not present

## 2018-03-30 DIAGNOSIS — K219 Gastro-esophageal reflux disease without esophagitis: Secondary | ICD-10-CM | POA: Diagnosis not present

## 2018-04-04 DIAGNOSIS — G4733 Obstructive sleep apnea (adult) (pediatric): Secondary | ICD-10-CM | POA: Diagnosis not present

## 2018-04-30 DIAGNOSIS — K219 Gastro-esophageal reflux disease without esophagitis: Secondary | ICD-10-CM | POA: Diagnosis not present

## 2018-04-30 DIAGNOSIS — I10 Essential (primary) hypertension: Secondary | ICD-10-CM | POA: Diagnosis not present

## 2018-05-30 DIAGNOSIS — K219 Gastro-esophageal reflux disease without esophagitis: Secondary | ICD-10-CM | POA: Diagnosis not present

## 2018-05-30 DIAGNOSIS — I10 Essential (primary) hypertension: Secondary | ICD-10-CM | POA: Diagnosis not present

## 2018-06-29 DIAGNOSIS — I10 Essential (primary) hypertension: Secondary | ICD-10-CM | POA: Diagnosis not present

## 2018-06-29 DIAGNOSIS — F419 Anxiety disorder, unspecified: Secondary | ICD-10-CM | POA: Diagnosis not present

## 2018-07-10 DIAGNOSIS — Z791 Long term (current) use of non-steroidal anti-inflammatories (NSAID): Secondary | ICD-10-CM | POA: Diagnosis not present

## 2018-07-10 DIAGNOSIS — I10 Essential (primary) hypertension: Secondary | ICD-10-CM | POA: Diagnosis not present

## 2018-07-10 DIAGNOSIS — H6092 Unspecified otitis externa, left ear: Secondary | ICD-10-CM | POA: Diagnosis not present

## 2018-07-10 DIAGNOSIS — Z7982 Long term (current) use of aspirin: Secondary | ICD-10-CM | POA: Diagnosis not present

## 2018-07-10 DIAGNOSIS — M1990 Unspecified osteoarthritis, unspecified site: Secondary | ICD-10-CM | POA: Diagnosis not present

## 2018-07-10 DIAGNOSIS — H9202 Otalgia, left ear: Secondary | ICD-10-CM | POA: Diagnosis not present

## 2018-07-10 DIAGNOSIS — H60502 Unspecified acute noninfective otitis externa, left ear: Secondary | ICD-10-CM | POA: Diagnosis not present

## 2018-07-10 DIAGNOSIS — J449 Chronic obstructive pulmonary disease, unspecified: Secondary | ICD-10-CM | POA: Diagnosis not present

## 2018-07-10 DIAGNOSIS — F17200 Nicotine dependence, unspecified, uncomplicated: Secondary | ICD-10-CM | POA: Diagnosis not present

## 2018-07-19 DIAGNOSIS — Z6829 Body mass index (BMI) 29.0-29.9, adult: Secondary | ICD-10-CM | POA: Diagnosis not present

## 2018-07-19 DIAGNOSIS — M7701 Medial epicondylitis, right elbow: Secondary | ICD-10-CM | POA: Diagnosis not present

## 2018-07-19 DIAGNOSIS — L918 Other hypertrophic disorders of the skin: Secondary | ICD-10-CM | POA: Diagnosis not present

## 2018-07-19 DIAGNOSIS — H6092 Unspecified otitis externa, left ear: Secondary | ICD-10-CM | POA: Diagnosis not present

## 2018-07-30 DIAGNOSIS — E785 Hyperlipidemia, unspecified: Secondary | ICD-10-CM | POA: Diagnosis not present

## 2018-07-30 DIAGNOSIS — I10 Essential (primary) hypertension: Secondary | ICD-10-CM | POA: Diagnosis not present

## 2018-09-30 DIAGNOSIS — E782 Mixed hyperlipidemia: Secondary | ICD-10-CM | POA: Diagnosis not present

## 2018-09-30 DIAGNOSIS — I10 Essential (primary) hypertension: Secondary | ICD-10-CM | POA: Diagnosis not present

## 2018-10-30 DIAGNOSIS — I10 Essential (primary) hypertension: Secondary | ICD-10-CM | POA: Diagnosis not present

## 2018-11-29 DIAGNOSIS — I10 Essential (primary) hypertension: Secondary | ICD-10-CM | POA: Diagnosis not present

## 2018-11-29 DIAGNOSIS — K219 Gastro-esophageal reflux disease without esophagitis: Secondary | ICD-10-CM | POA: Diagnosis not present

## 2018-12-30 DIAGNOSIS — I10 Essential (primary) hypertension: Secondary | ICD-10-CM | POA: Diagnosis not present

## 2018-12-30 DIAGNOSIS — E782 Mixed hyperlipidemia: Secondary | ICD-10-CM | POA: Diagnosis not present

## 2018-12-30 DIAGNOSIS — K219 Gastro-esophageal reflux disease without esophagitis: Secondary | ICD-10-CM | POA: Diagnosis not present

## 2019-03-01 DIAGNOSIS — K219 Gastro-esophageal reflux disease without esophagitis: Secondary | ICD-10-CM | POA: Diagnosis not present

## 2019-03-01 DIAGNOSIS — I10 Essential (primary) hypertension: Secondary | ICD-10-CM | POA: Diagnosis not present

## 2019-03-04 DIAGNOSIS — M7711 Lateral epicondylitis, right elbow: Secondary | ICD-10-CM | POA: Diagnosis not present

## 2019-03-04 DIAGNOSIS — M542 Cervicalgia: Secondary | ICD-10-CM | POA: Diagnosis not present

## 2019-04-30 DIAGNOSIS — I10 Essential (primary) hypertension: Secondary | ICD-10-CM | POA: Diagnosis not present

## 2019-04-30 DIAGNOSIS — E782 Mixed hyperlipidemia: Secondary | ICD-10-CM | POA: Diagnosis not present

## 2019-05-30 DIAGNOSIS — I10 Essential (primary) hypertension: Secondary | ICD-10-CM | POA: Diagnosis not present

## 2019-05-30 DIAGNOSIS — K219 Gastro-esophageal reflux disease without esophagitis: Secondary | ICD-10-CM | POA: Diagnosis not present

## 2019-05-30 DIAGNOSIS — E782 Mixed hyperlipidemia: Secondary | ICD-10-CM | POA: Diagnosis not present

## 2019-06-30 DIAGNOSIS — I10 Essential (primary) hypertension: Secondary | ICD-10-CM | POA: Diagnosis not present

## 2019-06-30 DIAGNOSIS — E785 Hyperlipidemia, unspecified: Secondary | ICD-10-CM | POA: Diagnosis not present

## 2019-06-30 DIAGNOSIS — K219 Gastro-esophageal reflux disease without esophagitis: Secondary | ICD-10-CM | POA: Diagnosis not present

## 2019-07-30 DIAGNOSIS — K219 Gastro-esophageal reflux disease without esophagitis: Secondary | ICD-10-CM | POA: Diagnosis not present

## 2019-07-30 DIAGNOSIS — I10 Essential (primary) hypertension: Secondary | ICD-10-CM | POA: Diagnosis not present

## 2019-07-30 DIAGNOSIS — E785 Hyperlipidemia, unspecified: Secondary | ICD-10-CM | POA: Diagnosis not present

## 2019-08-30 DIAGNOSIS — I10 Essential (primary) hypertension: Secondary | ICD-10-CM | POA: Diagnosis not present

## 2019-08-30 DIAGNOSIS — K219 Gastro-esophageal reflux disease without esophagitis: Secondary | ICD-10-CM | POA: Diagnosis not present

## 2019-08-30 DIAGNOSIS — E785 Hyperlipidemia, unspecified: Secondary | ICD-10-CM | POA: Diagnosis not present

## 2019-09-30 DIAGNOSIS — E785 Hyperlipidemia, unspecified: Secondary | ICD-10-CM | POA: Diagnosis not present

## 2019-09-30 DIAGNOSIS — I10 Essential (primary) hypertension: Secondary | ICD-10-CM | POA: Diagnosis not present

## 2019-09-30 DIAGNOSIS — K219 Gastro-esophageal reflux disease without esophagitis: Secondary | ICD-10-CM | POA: Diagnosis not present

## 2019-12-30 DIAGNOSIS — I10 Essential (primary) hypertension: Secondary | ICD-10-CM | POA: Diagnosis not present

## 2019-12-30 DIAGNOSIS — K219 Gastro-esophageal reflux disease without esophagitis: Secondary | ICD-10-CM | POA: Diagnosis not present

## 2019-12-30 DIAGNOSIS — E785 Hyperlipidemia, unspecified: Secondary | ICD-10-CM | POA: Diagnosis not present

## 2020-01-20 DIAGNOSIS — E782 Mixed hyperlipidemia: Secondary | ICD-10-CM | POA: Diagnosis not present

## 2020-01-29 DIAGNOSIS — I10 Essential (primary) hypertension: Secondary | ICD-10-CM | POA: Diagnosis not present

## 2020-01-29 DIAGNOSIS — E7800 Pure hypercholesterolemia, unspecified: Secondary | ICD-10-CM | POA: Diagnosis not present

## 2020-02-11 ENCOUNTER — Other Ambulatory Visit: Payer: Self-pay | Admitting: *Deleted

## 2020-02-11 NOTE — Patient Outreach (Signed)
[facility]) Care Management  02/11/2020  Jimmy Morrison [birth year] [id number]   Referral Date: 02/10/2020 Referral Source: MD office Referral Reason: Food resources Insurance: [place] attempt #1, unsuccessful.  Member has multiple numbers listed in chart, all were invalid.  Call was placed to PCP office, receptionist was able to provide this care manger with 2 additional number (home - [phone] and cell - [phone]).  Call was also placed to those number, cell number is invalid, HIPAA compliant message left at home line given by MD office.    Plan: RN CM will send unsuccessful outreach letter and follow up within the next 3-4 business days.  Valente David, [place], MSN [place] [phone]

## 2020-02-15 ENCOUNTER — Other Ambulatory Visit: Payer: Self-pay

## 2020-02-15 ENCOUNTER — Emergency Department (HOSPITAL_COMMUNITY)
Admission: EM | Admit: 2020-02-15 | Discharge: 2020-02-15 | Disposition: A | Discharge status: Home / Self Care | Payer: Medicare Other | Attending: Emergency Medicine | Admitting: Emergency Medicine

## 2020-02-15 ENCOUNTER — Encounter (HOSPITAL_COMMUNITY): Payer: Self-pay

## 2020-02-15 DIAGNOSIS — T24231A Burn of second degree of right lower leg, initial encounter: Secondary | ICD-10-CM | POA: Insufficient documentation

## 2020-02-15 DIAGNOSIS — T310 Burns involving less than 10% of body surface: Secondary | ICD-10-CM | POA: Diagnosis not present

## 2020-02-15 DIAGNOSIS — Z23 Encounter for immunization: Secondary | ICD-10-CM | POA: Insufficient documentation

## 2020-02-15 DIAGNOSIS — I10 Essential (primary) hypertension: Secondary | ICD-10-CM | POA: Insufficient documentation

## 2020-02-15 DIAGNOSIS — X04XXXA Exposure to ignition of highly flammable material, initial encounter: Secondary | ICD-10-CM | POA: Insufficient documentation

## 2020-02-15 DIAGNOSIS — Z79899 Other long term (current) drug therapy: Secondary | ICD-10-CM | POA: Insufficient documentation

## 2020-02-15 DIAGNOSIS — T24232A Burn of second degree of left lower leg, initial encounter: Secondary | ICD-10-CM | POA: Insufficient documentation

## 2020-02-15 DIAGNOSIS — T300 Burn of unspecified body region, unspecified degree: Secondary | ICD-10-CM

## 2020-02-15 HISTORY — DX: Essential (primary) hypertension: I10

## 2020-02-15 MED ORDER — MORPHINE SULFATE (PF) 4 MG/ML IV SOLN
4.0000 mg | Freq: Once | INTRAVENOUS | Status: AC
  Administered 2020-02-15: 4 mg via INTRAVENOUS
  Filled 2020-02-15: qty 1

## 2020-02-15 MED ORDER — OXYCODONE-ACETAMINOPHEN 5-325 MG PO TABS: 1.0000 | ORAL_TABLET | Freq: Four times a day (QID) | ORAL | 0 refills | Status: DC | PRN

## 2020-02-15 MED ORDER — TETANUS-DIPHTH-ACELL PERTUSSIS 5-2.5-18.5 LF-MCG/0.5 IM SUSY
0.5000 mL | PREFILLED_SYRINGE | Freq: Once | INTRAMUSCULAR | Status: AC
  Administered 2020-02-15: 0.5 mL via INTRAMUSCULAR
  Filled 2020-02-15: qty 0.5

## 2020-02-15 MED ORDER — BACITRACIN ZINC 500 UNIT/GM EX OINT
TOPICAL_OINTMENT | Freq: Two times a day (BID) | CUTANEOUS | Status: DC
  Administered 2020-02-15: 1 via TOPICAL
  Filled 2020-02-15: qty 9

## 2020-02-15 MED ORDER — CEPHALEXIN 500 MG PO CAPS: 500.0000 mg | ORAL_CAPSULE | Freq: Three times a day (TID) | ORAL | 0 refills | Status: DC
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
## 2020-02-15 NOTE — ED Notes (Signed)
Dressing, bacitracin ointment applied to bilateral legs. Pt tolerated well.

## 2020-02-15 NOTE — ED Notes (Signed)
Tech to dress pts legs.

## 2020-02-15 NOTE — ED Notes (Signed)
Cool clothes applied to pts legs.

## 2020-02-17 ENCOUNTER — Other Ambulatory Visit: Payer: Self-pay | Admitting: *Deleted

## 2020-02-17 DIAGNOSIS — I10 Essential (primary) hypertension: Secondary | ICD-10-CM

## 2020-02-17 DIAGNOSIS — T24231A Burn of second degree of right lower leg, initial encounter: Secondary | ICD-10-CM | POA: Diagnosis not present

## 2020-02-17 DIAGNOSIS — T24232A Burn of second degree of left lower leg, initial encounter: Secondary | ICD-10-CM | POA: Diagnosis not present

## 2020-02-17 DIAGNOSIS — Z139 Encounter for screening, unspecified: Secondary | ICD-10-CM
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
## 2020-02-18 ENCOUNTER — Encounter: Payer: Self-pay | Admitting: *Deleted

## 2020-02-18 ENCOUNTER — Ambulatory Visit (INDEPENDENT_AMBULATORY_CARE_PROVIDER_SITE_OTHER): Payer: Medicare Other | Admitting: Surgical

## 2020-02-18 ENCOUNTER — Other Ambulatory Visit: Payer: Self-pay | Admitting: *Deleted

## 2020-02-18 ENCOUNTER — Other Ambulatory Visit: Payer: Self-pay

## 2020-02-18 ENCOUNTER — Telehealth: Payer: Self-pay

## 2020-02-18 ENCOUNTER — Encounter: Payer: Self-pay | Admitting: Surgical

## 2020-02-18 VITALS — BP 132/87 | HR 72 | Ht 69.0 in | Wt 219.8 lb

## 2020-02-18 DIAGNOSIS — T24001A Burn of unspecified degree of unspecified site of right lower limb, except ankle and foot, initial encounter: Secondary | ICD-10-CM | POA: Diagnosis not present

## 2020-02-18 DIAGNOSIS — T24002A Burn of unspecified degree of unspecified site of left lower limb, except ankle and foot, initial encounter: Secondary | ICD-10-CM | POA: Diagnosis not present

## 2020-02-18 NOTE — Patient Instructions (Addendum)
Goals Addressed            This Visit's Progress   . [facility] - Find Help in My Community       Timeframe:  Long-Range Goal Priority:  High Start Date:         02/17/2020                    Expected End Date:     04/16/2020                  Follow Up Date 1/24   - follow-up on any referrals for help I am given - think ahead to make sure my need does not become an emergency - make a list of family or friends that I can call    Why is this important?    Knowing how and where to find help for yourself or family in your neighborhood and community is an important skill.   You will want to take some steps to learn how.    Notes:     . [facility] - Make and Keep All Appointments       Timeframe:  Short-Term Goal Priority:  Medium Start Date:          02/17/2020                   Expected End Date:     03/19/2020                  Follow Up Date 02/23/2020   - ask family or friend for a ride - call to cancel if needed - keep a calendar with appointment dates    Why is this important?    Part of staying healthy is seeing the doctor for follow-up care.   If you forget your appointments, there are some things you can do to stay on track.    Notes:       Trude Mcburney textbook of surgery (20th ed., pp. [phone]). [place], [place]: Elsevier."> Current surgical therapy (12th ed., pp. [phone]). [place], [place]: Elsevier."> Ferri's Clinical Advisor 2018 (pp. [phone].[phone]). [place]: Elsevier."> Rosen's emergency medicine: Concepts and clinical practice (9th ed., pp. [phone]). [place]: Elsevier.">  Second-Degree Burn, Adult  A second-degree burn, also called a partial thickness wound, is a serious injury that affects the first two layers of skin (epidermis and dermis). A second-degree burn may be minor or major, depending on the size and parts of the skin that are burned. What are the causes? This condition may be caused by:  Heat, such as from a flame or hot liquid.  Radiation,  such as from sunlight or radiation treatments.  Electricity. This can happen when electricity passes through the body, such as from lightning, electrical outlets, or power lines.  Certain chemicals, such as acids that come into contact with the skin or eyes. Some chemicals can go through clothing. What increases the risk? The following factors may make you more likely to develop this condition:  Being exposed to high-risk environments, such as those with open flames, chemicals, or electricity.  Having cancer and being treated with radiation. What are the signs or symptoms? Symptoms of this condition include:  Severe pain.  Changes in the skin. The skin can be deep red, blistered, tender, swollen, blotchy, or shiny. How is this diagnosed? This condition is usually diagnosed with a physical exam. Your health care provider may remove any blistered skin during the exam. It may take several  days to diagnose the condition because this kind of burn can take time to develop. Watch the wound for changes at home. Visit a health care provider often to have your wound checked. If the wound is large, you may stay in the hospital so a health care team can examine the wound for a few days. How is this treated? Treatment depends on the severity and cause of the burn. Some second-degree burns, including major burns, electrical burns, and chemical burns, may need to be treated in a hospital. Treatment may include:  Cooling the burn with cool, germ-free (sterile) water.  Taking or applying medicines, such as: ? Medicines to relieve pain or itching. ? Ointments to treat or prevent infection. ? Antibiotic medicine to treat or prevent infection.  Getting a tetanus shot.  Covering the burn with a bandage (dressing).  Applying pressure dressings to prevent scarring and to keep mobility in the burned part of the body.  Removing dead skin. This is done by a health care provider. Do not try to remove dead  skin yourself. In deep and large wounds, treatment involves:  Surgery to remove scabs.  Being given fluids and nutrition.  Close monitoring of blood flow near the wound.  Oxygen given through a mask or a machine (ventilator). Follow these instructions at home: Medicines  Take and apply over-the-counter and prescription medicines and ointments only as told by your health care provider.  If you were prescribed an antibiotic medicine, take or apply it as told by your health care provider. Do not stop using the antibiotic even if you start to feel better. Eating and drinking  Drink enough fluid to keep your urine pale yellow.  Eat a nutritious diet that is high in protein. This will help your wound heal.   Wound care  Follow instructions from your health care provider about how to take care of your wound. Make sure you: ? Wash your hands with soap and water for at least 20 seconds before and after you change your dressing. If soap and water are not available, use hand sanitizer. ? Change your dressing as told by your health care provider.  If you have a compression dressing, wear it as told by your health care provider.  Clean your wound 2 times a day, or as often as told by your health care provider. ? Wash the wound with mild soap and water. ? Rinse the wound with water to remove all soap. ? Pat the wound dry with a clean towel. Do not rub.  Check your wound every day for signs of infection. Check for: ? More redness, swelling, or pain. ? Fluid or blood. ? Warmth. ? Pus or a bad smell.  Do not scratch or pick at the wound.  Do not break any blisters or peel any skin.  Avoid exposing your wound to the sun.   General instructions  If possible, raise (elevate) the injured area above the level of your heart while you are sitting or lying down.  Rest as told by your health care provider. Do not exercise until your health care provider approves.  Do not take baths, swim,  use a hot tub, or do anything that would put your burn underwater until your health care provider approves. Ask your health care provider if you may take showers. You may only be allowed to take sponge baths.  Do not put ice on your burn. This can cause more damage. Try cooling the burn with: ? Cool water. ?  A cold, wet cloth (cold compress).  Do range-of-motion movements if told by your health care provider.  Do not use any products that contain nicotine or tobacco, such as cigarettes, e-cigarettes, and chewing tobacco. These can delay healing. If you need help quitting, ask your health care provider.  Keep all follow-up visits as told by your health care provider. This is important. How is this prevented?  Make sure your water heaters are set to 120F (49C) or lower.  Make sure you know how to get out of your home in case of a fire.  Install smoke alarms in your home. Check them regularly to make sure they are working. Contact a health care provider if:  Your symptoms do not improve with treatment.  Your pain is not relieved with medicine.  You have more redness, swelling, or pain around your wound.  You have fluid, blood, pus, or a bad smell coming from the wound.  Your wound feels warm to the touch.  You have a fever or chills. Get help right away if:  You develop red streaks near the wound.  You develop severe pain. Summary  A second-degree burn is a serious injury that affects the first two layers of skin.  Clean your wound 2 times a day or as often as told. Check your wound every day for signs of infection.  Do not scratch or pick at your wound, break blisters, peel skin, or put ice on your burn. This information is not intended to replace advice given to you by your health care provider. Make sure you discuss any questions you have with your health care provider. Document Revised: 11/20/2018 Document Reviewed: 11/20/2018 Elsevier Patient Education  2021 [place], Adult A burn is an injury to the skin or the tissues under the skin. There are three types of burns:  First degree. These burns may cause the skin to be red and a bit swollen.  Second degree. These burns are very painful and cause the skin to be very red. The skin may also swell, leak fluid, look shiny, and start to have blisters.  Third degree. These burns cause lasting damage. They turn the skin white or black and make it look charred, dry, and leathery. Treatment for your burn will depend on the type of burn you have. Taking good care of your burn can help to prevent pain and infection. It can also help the burn heal quickly. How to care for a first-degree burn Right after a burn:  Rinse or soak the burn under cool water for 5 minutes or more. Do not put ice on your burn. That can cause more damage.  Put a cool, clean, wet cloth on your burn.  Put lotion or gel with aloe vera on your burn. Caring for the burn Clean and care for your burn. Your doctor may tell you:  To clean the burn using soap and water.  To pat the burn dry using a clean cloth. Do not rub or scrub the burn.  To put lotion or gel with aloe vera on your burn. How to care for a second-degree burn Right after a burn:  Rinse or soak the burn under cool water. Do this for 5 to 10 minutes. Do not put ice on your burn. This can cause more damage.  Remove any jewelry near the burned area.  Cover the burn with a clean cloth. Caring for the burn  Raise (elevate) the burned area above the  level of your heart while sitting or lying down.  Clean and care for your burn. Your doctor may tell you: ? To clean or rinse your burn. ? To put a cream or ointment on the burn. ? To place a germ-free (sterile) dressing over the burn. A dressing is a material that is placed on a burn to help it heal. How to care for a third-degree burn Right after a burn:  Cover the burn with a clean, dry cloth.  Seek  treatment right away if you have this kind of burn. You may: ? Need to stay in the hospital. ? Have surgery to remove burned tissue. ? Have surgery to put new skin on the burned area. ? Be given fluids through an IV tube. Caring for the burn Clean and care for your burn. Your doctor may tell you:  To clean or rinse your burn.  To put a cream or ointment on the burn.  To put a sterile dressing in the burn. This is called packing.  To place a sterile dressing over the burn. Other things to do  Raise the burned area above the level of your heart while sitting or lying down.  Wear splints or immobilizers if told by your doctor.  Rest as told by your doctor. Do not do sports or other activities until your doctor approves. How to prevent infection when caring for a burn  Take these steps to prevent infection: ? Wash your hands with soap and water for at least 20 seconds before and after caring for your burn. If you cannot use soap and water, use hand sanitizer. ? Wear clean or sterile gloves as told by your doctor. ? Do not put butter, oil, toothpaste, or other home remedies on the burn. ? Do not scratch or pick at the burn. ? Do not break any blisters. ? Do not peel the skin. ? Do not rub your burn, even when you are cleaning it.  Check your burn every day for these signs of infection: ? More redness, swelling, or pain. ? Warmth. ? Pus or a bad smell. ? Red streaks around the burn.   Follow these instructions at home Medicines  Take over-the-counter and prescription medicines only as told by your doctor.  If you were prescribed an antibiotic medicine, use it as told by your doctor. Do not stop using the antibiotic even if your condition gets better.  Your doctor may ask you to take medicine for pain before you change your dressing. General instructions  Protect your burn from the sun.  Drink enough fluid to keep your pee (urine) pale yellow.  Do not use any products  that contain nicotine or tobacco, such as cigarettes, e-cigarettes, and chewing tobacco. These can delay healing. If you need help quitting, ask your doctor.  Keep all follow-up visits as told by your doctor. This is important.   Contact a doctor if:  Your condition does not get better.  Your condition gets worse.  You have a fever or chills.  Your burn feels warm to the touch.  You have more redness, swelling, or pain on your burn.  Your burn looks different or starts to have black or red spots on it.  Your pain does not get better with medicine. Get help right away if:  You have more fluid, blood, or pus coming from your burn.  You have red streaks near the burn.  You have very bad pain. Summary  There are three types  of burns. They are first degree, second degree, and third degree. Of these, a third-degree burn is most serious. This must be treated right away.  Treatment for your burn will depend on the type of burn you have.  Do not put butter, oil, toothpaste, or other home remedies on the burn. These things can damage your skin.  Follow instructions from your doctor about how to clean and take care of your burn. This information is not intended to replace advice given to you by your health care provider. Make sure you discuss any questions you have with your health care provider. Document Revised: 03/07/2019 Document Reviewed: 11/05/2018 Elsevier Patient Education  [place].

## 2020-02-18 NOTE — Telephone Encounter (Signed)
Faxed prism order: wound 1, 2 and 3-ABD pads, Kerlix, 4x4, medipore tape, and  xerofrom to be changed daily

## 2020-02-18 NOTE — Patient Outreach (Addendum)
[facility]) Care Management  02/18/2020  Jimmy Morrison 1965-09-12 [id number]  CSW made an initial attempt to try and contact patient today to perform the phone assessment, as well as assess and assist with social work needs and services, without success.  A HIPAA compliant message was left for patient on voicemail, CSW is currently awaiting a return call.  CSW will make a second outreach attempt within the next 3-4 business days, if a return call is not received from patient in the meantime.  CSW will also mail a Patient Unsuccessful Outreach Letter to patient's home, requesting that he contact CSW if he is interested in receiving social work services through [place] with [facility].  Nat Christen, BSW, MSW, LCSW  Licensed Education officer, [facility]  Mailing [place] [address], [place], [place] [address] Physical Address-[address] [place], [place] [address] Toll Free Main # [phone] Fax # [phone] Cell # [phone]  [email or web address]
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
## 2020-02-19 DIAGNOSIS — T24031A Burn of unspecified degree of right lower leg, initial encounter: Secondary | ICD-10-CM | POA: Diagnosis not present

## 2020-02-20 ENCOUNTER — Telehealth: Payer: Self-pay | Admitting: Family Medicine
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
## 2020-02-23 ENCOUNTER — Other Ambulatory Visit: Payer: Self-pay | Admitting: *Deleted
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
## 2020-02-23 NOTE — Patient Outreach (Signed)
[facility]) Care Management  02/23/2020  JONPAUL LUMM 09/29/1965 [id number]   Call placed to member to complete initial assessment, no answer, HIPAA compliant voice message left.  Will follow up within the next 3-4 business days.    Valente David, [place], MSN [place] [phone]

## 2020-02-24 ENCOUNTER — Ambulatory Visit: Payer: Medicare Other | Admitting: *Deleted

## 2020-02-26 ENCOUNTER — Other Ambulatory Visit: Payer: Self-pay

## 2020-02-26 ENCOUNTER — Telehealth: Payer: Self-pay | Admitting: Family Medicine

## 2020-02-26 ENCOUNTER — Ambulatory Visit (INDEPENDENT_AMBULATORY_CARE_PROVIDER_SITE_OTHER): Payer: Medicare Other | Admitting: Surgical

## 2020-02-26 ENCOUNTER — Encounter: Payer: Self-pay | Admitting: Surgical

## 2020-02-26 VITALS — BP 150/83 | HR 59

## 2020-02-26 DIAGNOSIS — T24001D Burn of unspecified degree of unspecified site of right lower limb, except ankle and foot, subsequent encounter: Secondary | ICD-10-CM

## 2020-02-26 DIAGNOSIS — T24002D Burn of unspecified degree of unspecified site of left lower limb, except ankle and foot, subsequent encounter: Secondary | ICD-10-CM

## 2020-02-26 MED ORDER — TRAMADOL HCL 50 MG PO TABS: 50.0000 mg | ORAL_TABLET | Freq: Four times a day (QID) | ORAL | 0 refills | Status: AC | PRN

## 2020-02-26 NOTE — Addendum Note (Signed)
Addended byRoetta Sessions on: 02/26/2020 03:56 PM   Modules accepted: Orders
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
## 2020-02-26 NOTE — Telephone Encounter (Signed)
   Telephone encounter was:  Successful.  02/26/2020 Name: RUSTIN ERHART MRN: [id number] DOB: 12-24-1965  JAMORIAN DIMARIA is a 55 y.o. year old male who is a primary care patient of Mateo Flow, MD . The community resource team was consulted for assistance with Financial Difficulties related to housing, and utilities.  Care guide performed the following interventions: Spoke with patient today. Mr. Mccartt stated that he has received assistance from several organizations, but the only thing he really needs is a home health aide. Informed patient that Care Guide can provide a list of home health agencies, but he will possibly need to speak with his provider to create a referral. Patient stated understanding. .  Follow Up Plan:  Care Guide will give patient a call with a list of home health agencies.   [place], Care Management Phone: [phone] Email: [email or web address]

## 2020-02-27 ENCOUNTER — Other Ambulatory Visit: Payer: Self-pay | Admitting: *Deleted

## 2020-02-27 NOTE — Patient Outreach (Signed)
[facility]) Care Management  02/27/2020  Jimmy Morrison 17-Mar-1965 [id number]   Outgoing call placed to member, state he is currently driving, unable to talk.  Request to call this care manager back.  Will await call back, if no call back, will follow up within the next 3-4 business days.  Valente David, [place], MSN [place] [phone]

## 2020-02-29 DIAGNOSIS — K219 Gastro-esophageal reflux disease without esophagitis: Secondary | ICD-10-CM | POA: Diagnosis not present

## 2020-02-29 DIAGNOSIS — E785 Hyperlipidemia, unspecified: Secondary | ICD-10-CM | POA: Diagnosis not present

## 2020-02-29 DIAGNOSIS — I10 Essential (primary) hypertension: Secondary | ICD-10-CM | POA: Diagnosis not present

## 2020-03-02 NOTE — Telephone Encounter (Signed)
   Telephone encounter was:  Unsuccessful.  03/02/2020 Name: Jimmy Morrison MRN: [id number] DOB: 01-20-1966  Unsuccessful outbound call made today to assist with:  home health aide assistance  Outreach Attempt:  3rd Attempt.  Referral closed unable to contact patient.  A HIPAA compliant voice message was left requesting a return call.  Instructed patient to call back at [phone].    [place], Care Management Phone: [phone] Email: [email or web address]

## 2020-03-04 ENCOUNTER — Encounter: Payer: Self-pay | Admitting: *Deleted

## 2020-03-04 ENCOUNTER — Other Ambulatory Visit: Payer: Self-pay | Admitting: *Deleted

## 2020-03-04 NOTE — Patient Outreach (Signed)
[facility]) Care Management  03/04/2020  Jimmy Morrison 1965-04-10 [id number]   Outreach attempt #2, successful.  He report he is doing much better, was last seen by plastics on 1/27.  Per notes, wounds are healing, performing daily dressing changes.  Follow up scheduled for 2/10.  State he has been able to replace all medications, taking as instructed.  They now have a place to live, concerned that the bills will be too expensive to maintain.  Advised that he has been assigned a Care Guide that has provided community resources.  Provided with contact information for Care Guide, advised to call with questions.  Denies any urgent concerns, encouraged to contact this care manager with questions.  Agrees to follow up within the next month.  Goals Addressed            This Visit's Progress   . [facility] - Find Help in My Community   On track    Timeframe:  Long-Range Goal Priority:  High Start Date:         02/17/2020                    Expected End Date:     04/16/2020                  Follow Up Date 3/3   - follow-up on any referrals for help I am given - think ahead to make sure my need does not become an emergency - make a list of family or friends that I can call    Why is this important?    Knowing how and where to find help for yourself or family in your neighborhood and community is an important skill.   You will want to take some steps to learn how.    Notes:   2/3 - Provided with contact information for Care Guide    . [facility] - Make and Keep All Appointments   On track    Timeframe:  Short-Term Goal Priority:  Medium Start Date:          02/17/2020                   Expected End Date:     03/19/2020                  Follow Up Date 04/01/2020   - ask family or friend for a ride - call to cancel if needed - keep a calendar with appointment dates    Why is this important?    Part of staying healthy is seeing the doctor for follow-up care.   If you forget  your appointments, there are some things you can do to stay on track.    Notes:       Valente David, RN, MSN [place] [phone]

## 2020-03-11 ENCOUNTER — Encounter: Payer: Self-pay | Admitting: Surgical

## 2020-03-11 ENCOUNTER — Ambulatory Visit (INDEPENDENT_AMBULATORY_CARE_PROVIDER_SITE_OTHER): Payer: Medicare Other | Admitting: Surgical

## 2020-03-11 ENCOUNTER — Other Ambulatory Visit: Payer: Self-pay

## 2020-03-11 VITALS — BP 138/90 | HR 61

## 2020-03-11 DIAGNOSIS — T24001D Burn of unspecified degree of unspecified site of right lower limb, except ankle and foot, subsequent encounter: Secondary | ICD-10-CM

## 2020-03-11 DIAGNOSIS — T24002D Burn of unspecified degree of unspecified site of left lower limb, except ankle and foot, subsequent encounter: Secondary | ICD-10-CM | POA: Diagnosis not present
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
## 2020-03-12 DIAGNOSIS — T24001D Burn of unspecified degree of unspecified site of right lower limb, except ankle and foot, subsequent encounter: Secondary | ICD-10-CM | POA: Diagnosis not present

## 2020-03-12 DIAGNOSIS — Z720 Tobacco use: Secondary | ICD-10-CM | POA: Diagnosis not present

## 2020-03-12 DIAGNOSIS — I10 Essential (primary) hypertension: Secondary | ICD-10-CM | POA: Diagnosis not present

## 2020-03-12 DIAGNOSIS — M25511 Pain in right shoulder: Secondary | ICD-10-CM | POA: Diagnosis not present

## 2020-03-15 DIAGNOSIS — M25511 Pain in right shoulder: Secondary | ICD-10-CM | POA: Diagnosis not present

## 2020-03-29 DIAGNOSIS — I10 Essential (primary) hypertension: Secondary | ICD-10-CM | POA: Diagnosis not present

## 2020-03-29 DIAGNOSIS — E785 Hyperlipidemia, unspecified: Secondary | ICD-10-CM | POA: Diagnosis not present

## 2020-03-29 DIAGNOSIS — K219 Gastro-esophageal reflux disease without esophagitis: Secondary | ICD-10-CM | POA: Diagnosis not present

## 2020-03-30 ENCOUNTER — Ambulatory Visit (INDEPENDENT_AMBULATORY_CARE_PROVIDER_SITE_OTHER): Payer: Medicare Other | Admitting: Surgical

## 2020-03-30 ENCOUNTER — Encounter: Payer: Self-pay | Admitting: Surgical

## 2020-03-30 ENCOUNTER — Other Ambulatory Visit: Payer: Self-pay

## 2020-03-30 VITALS — BP 128/80 | HR 79

## 2020-03-30 DIAGNOSIS — T24002D Burn of unspecified degree of unspecified site of left lower limb, except ankle and foot, subsequent encounter: Secondary | ICD-10-CM

## 2020-03-30 DIAGNOSIS — T24001D Burn of unspecified degree of unspecified site of right lower limb, except ankle and foot, subsequent encounter: Secondary | ICD-10-CM | POA: Diagnosis not present
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
## 2020-04-01 ENCOUNTER — Other Ambulatory Visit: Payer: Self-pay | Admitting: *Deleted

## 2020-04-01 NOTE — Patient Outreach (Signed)
[facility]) Care Management  04/01/2020  YASSINE BRUNSMAN [birth year] [id number]   Outgoing call placed to member, no answer, HIPAA compliant voice message left.  Will follow up within the next 3-4 business days.  Valente David, [place], MSN [place] [phone]

## 2020-04-07 ENCOUNTER — Other Ambulatory Visit: Payer: Self-pay | Admitting: *Deleted

## 2020-04-07 NOTE — Patient Outreach (Signed)
[name]) Care Management  04/07/2020  Jimmy Morrison 1966-01-10 [id number]   Outreach attempt #2, unsuccessful, HIPAA compliant voice message left. Will send outreach letter and follow up within the next 3-4 business days.  Valente David, [place], MSN [place] [phone]

## 2020-04-12 ENCOUNTER — Other Ambulatory Visit: Payer: Self-pay | Admitting: *Deleted

## 2020-04-12 NOTE — Patient Outreach (Signed)
[facility]) Care Management  04/12/2020  CORIAN HANDLEY 05-Apr-1965 [id number]   Outreach attempt #3, unsuccessful, HIPAA compliant voice message left.  Will follow up with 4th and final attempt within the next 4 weeks.  If remain unsuccessful, will close case due to inability to maintain contact.  Valente David, [place], MSN [place] [phone]

## 2020-04-22 DIAGNOSIS — M542 Cervicalgia: Secondary | ICD-10-CM | POA: Diagnosis not present

## 2020-04-22 DIAGNOSIS — M25511 Pain in right shoulder: Secondary | ICD-10-CM | POA: Diagnosis not present

## 2020-04-22 DIAGNOSIS — M79601 Pain in right arm: Secondary | ICD-10-CM

## 2020-04-22 DIAGNOSIS — M5412 Radiculopathy, cervical region: Secondary | ICD-10-CM | POA: Diagnosis not present

## 2020-04-22 HISTORY — DX: Pain in right arm: M79.601

## 2020-04-26 DIAGNOSIS — M5412 Radiculopathy, cervical region: Secondary | ICD-10-CM | POA: Diagnosis not present

## 2020-04-26 DIAGNOSIS — I10 Essential (primary) hypertension: Secondary | ICD-10-CM | POA: Diagnosis not present

## 2020-04-27 DIAGNOSIS — M542 Cervicalgia: Secondary | ICD-10-CM | POA: Diagnosis not present

## 2020-04-27 DIAGNOSIS — M5412 Radiculopathy, cervical region: Secondary | ICD-10-CM | POA: Diagnosis not present

## 2020-04-28 DIAGNOSIS — E785 Hyperlipidemia, unspecified: Secondary | ICD-10-CM | POA: Diagnosis not present

## 2020-04-28 DIAGNOSIS — K219 Gastro-esophageal reflux disease without esophagitis: Secondary | ICD-10-CM | POA: Diagnosis not present

## 2020-04-28 DIAGNOSIS — I10 Essential (primary) hypertension: Secondary | ICD-10-CM | POA: Diagnosis not present

## 2020-05-04 DIAGNOSIS — M5412 Radiculopathy, cervical region: Secondary | ICD-10-CM | POA: Diagnosis not present

## 2020-05-04 DIAGNOSIS — I10 Essential (primary) hypertension: Secondary | ICD-10-CM | POA: Diagnosis not present

## 2020-05-06 ENCOUNTER — Other Ambulatory Visit: Payer: Self-pay | Admitting: Neurosurgery

## 2020-05-10 ENCOUNTER — Other Ambulatory Visit: Payer: Self-pay | Admitting: *Deleted
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
## 2020-05-11 ENCOUNTER — Encounter (HOSPITAL_COMMUNITY)
Admission: RE | Admit: 2020-05-11 | Discharge: 2020-05-11 | Disposition: A | Discharge status: Home / Self Care | Payer: Medicare Other | Source: Ambulatory Visit | Attending: Neurosurgery | Admitting: Neurosurgery

## 2020-05-11 ENCOUNTER — Encounter (HOSPITAL_COMMUNITY): Payer: Self-pay

## 2020-05-11 ENCOUNTER — Other Ambulatory Visit: Payer: Self-pay

## 2020-05-11 DIAGNOSIS — F17210 Nicotine dependence, cigarettes, uncomplicated: Secondary | ICD-10-CM | POA: Diagnosis not present

## 2020-05-11 DIAGNOSIS — I10 Essential (primary) hypertension: Secondary | ICD-10-CM | POA: Diagnosis not present

## 2020-05-11 DIAGNOSIS — L905 Scar conditions and fibrosis of skin: Secondary | ICD-10-CM | POA: Diagnosis not present

## 2020-05-11 DIAGNOSIS — Z20822 Contact with and (suspected) exposure to covid-19: Secondary | ICD-10-CM | POA: Insufficient documentation

## 2020-05-11 DIAGNOSIS — M2578 Osteophyte, vertebrae: Secondary | ICD-10-CM | POA: Diagnosis not present

## 2020-05-11 DIAGNOSIS — M1990 Unspecified osteoarthritis, unspecified site: Secondary | ICD-10-CM | POA: Diagnosis not present

## 2020-05-11 DIAGNOSIS — J449 Chronic obstructive pulmonary disease, unspecified: Secondary | ICD-10-CM | POA: Diagnosis not present

## 2020-05-11 DIAGNOSIS — Z981 Arthrodesis status: Secondary | ICD-10-CM | POA: Diagnosis not present

## 2020-05-11 DIAGNOSIS — K219 Gastro-esophageal reflux disease without esophagitis: Secondary | ICD-10-CM | POA: Diagnosis not present

## 2020-05-11 DIAGNOSIS — Z01812 Encounter for preprocedural laboratory examination: Secondary | ICD-10-CM | POA: Insufficient documentation

## 2020-05-11 DIAGNOSIS — Z972 Presence of dental prosthetic device (complete) (partial): Secondary | ICD-10-CM | POA: Diagnosis not present

## 2020-05-11 DIAGNOSIS — M5412 Radiculopathy, cervical region: Secondary | ICD-10-CM | POA: Diagnosis not present

## 2020-05-11 DIAGNOSIS — Z888 Allergy status to other drugs, medicaments and biological substances status: Secondary | ICD-10-CM | POA: Diagnosis not present

## 2020-05-11 DIAGNOSIS — Z886 Allergy status to analgesic agent status: Secondary | ICD-10-CM | POA: Diagnosis not present

## 2020-05-11 DIAGNOSIS — Z79899 Other long term (current) drug therapy: Secondary | ICD-10-CM | POA: Diagnosis not present

## 2020-05-11 DIAGNOSIS — M4802 Spinal stenosis, cervical region: Secondary | ICD-10-CM | POA: Diagnosis not present

## 2020-05-11 DIAGNOSIS — Z7951 Long term (current) use of inhaled steroids: Secondary | ICD-10-CM | POA: Diagnosis not present

## 2020-05-11 HISTORY — DX: Gastro-esophageal reflux disease without esophagitis: K21.9

## 2020-05-11 HISTORY — DX: Depression, unspecified: F32.A

## 2020-05-11 HISTORY — DX: Unspecified osteoarthritis, unspecified site: M19.90

## 2020-05-11 HISTORY — DX: Chronic obstructive pulmonary disease, unspecified: J44.9

## 2020-05-11 HISTORY — DX: Bipolar disorder, unspecified: F31.9

## 2020-05-11 LAB — CBC
HCT: 45.9 % (ref 39.0–52.0)
Hemoglobin: 15.8 g/dL (ref 13.0–17.0)
MCH: 32.7 pg (ref 26.0–34.0)
MCHC: 34.4 g/dL (ref 30.0–36.0)
MCV: 95 fL (ref 80.0–100.0)
Platelets: 197 10*3/uL (ref 150–400)
RBC: 4.83 MIL/uL (ref 4.22–5.81)
RDW: 14.2 % (ref 11.5–15.5)
WBC: 9.7 10*3/uL (ref 4.0–10.5)
nRBC: 0 % (ref 0.0–0.2)

## 2020-05-11 LAB — TYPE AND SCREEN
ABO/RH(D): B POS
Antibody Screen: NEGATIVE

## 2020-05-11 LAB — BASIC METABOLIC PANEL
Anion gap: 7 (ref 5–15)
BUN: 8 mg/dL (ref 6–20)
CO2: 25 mmol/L (ref 22–32)
Calcium: 9.1 mg/dL (ref 8.9–10.3)
Chloride: 107 mmol/L (ref 98–111)
Creatinine, Ser: 1.29 mg/dL — ABNORMAL HIGH (ref 0.61–1.24)
GFR, Estimated: >60 mL/min (ref >60)
Glucose, Bld: 92 mg/dL (ref 70–99)
Potassium: 3.4 mmol/L — ABNORMAL LOW (ref 3.5–5.1)
Sodium: 139 mmol/L (ref 135–145)

## 2020-05-11 LAB — SURGICAL PCR SCREEN
MRSA, PCR: NEGATIVE
Staphylococcus aureus: NEGATIVE

## 2020-05-11 LAB — SARS CORONAVIRUS 2 (TAT 6-24 HRS): SARS Coronavirus 2: NEGATIVE

## 2020-05-11 NOTE — Progress Notes (Signed)
PCP - Dr. Bertram Millard  Cardiologist - no  Chest x-ray - na  EKG - 05/15/20  Stress Test - no  ECHO - no  Cardiac Cath - no  AICD-no PM-no LOOP-no  Sleep Study - yes- 2015- mild did not need a CPAP CPAP -   LABS-CBC, BMP, T/S, PCR, Covid test ASA- stopped last week- not sure what date  ERAS-no  HA1C-na Fasting Blood Sugar - na Checks Blood Sugar __0___ times a day  Anesthesia-  Pt denies having chest pain, sob, or fever at this time. All instructions explained to the pt, with a verbal understanding of the material. Pt agrees to go over the instructions while at home for a better understanding. Pt also instructed to self quarantine after being tested for COVID-19. The opportunity to ask questions was provided.

## 2020-05-11 NOTE — Pre-Procedure Instructions (Signed)
Jimmy Morrison  05/11/2020       Your procedure is scheduled on Wednesday, April 13.  Report to [facility], Main Entrance or Entrance "A" at 6:30 AM .                 Your surgery or procedure is scheduled to begin at 8:30 AM   Call this number if you have problems the morning of surgery: [phone]  This is the number for the Pre- Surgical Desk.                For any other questions, please call [phone], Monday - Friday 8 AM - 4 PM.   Remember:  Do not eat or drink after midnight.   Take these medicines the morning of surgery with A SIP OF WATER: albuterol (VENTOLIN HFA) 108 (90 Base) MCG/ACT inhaler DULoxetine (CYMBALTA) montelukast (SINGULAIR)  SPIRIVA RESPIMAT  VASCEPA  Take if needed: acetaminophen (TYLENOL) fluticasone (FLONASE) nasal spray    STOP taking Aspirin, Aspirin Products (Goody Powder, Excedrin Migraine), Ibuprofen (Advil), Naproxen (Aleve), Vitamins and Herbal Products (ie Fish Oil).    Special instructions:    [place]- Preparing For Surgery  Before surgery, you can play an important role. Because skin is not sterile, your skin needs to be as free of germs as possible. You can reduce the number of germs on your skin by washing with CHG (chlorahexidine gluconate) Soap before surgery.  CHG is an antiseptic cleaner which kills germs and bonds with the skin to continue killing germs even after washing.    Oral Hygiene is also important to reduce your risk of infection.  Remember - BRUSH YOUR TEETH THE MORNING OF SURGERY WITH YOUR REGULAR TOOTHPASTE  Please do not use if you have an allergy to CHG or antibacterial soaps. If your skin becomes reddened/irritated stop using the CHG.  Do not shave (including legs and underarms) for at least 48 hours prior to first CHG shower. It is OK to shave your face.  Please follow these instructions carefully.   1. Shower the NIGHT BEFORE SURGERY and the MORNING OF SURGERY with CHG.   2. If you  chose to wash your hair, wash your hair first as usual with your normal shampoo.  3. After you shampoo, wash your face and private area with the soap you use at home, then rinse your hair and body thoroughly to remove the shampoo and soap.  4. Use CHG as you would any other liquid soap. You can apply CHG directly to the skin and wash gently with a scrungie or a clean washcloth.   5. Apply the CHG Soap to your body ONLY FROM THE NECK DOWN.  Do not use on open wounds or open sores. Avoid contact with your eyes, ears, mouth and genitals (private parts).   6. Wash thoroughly, paying special attention to the area where your surgery will be performed.  7. Thoroughly rinse your body with warm water from the neck down.  8. DO NOT shower/wash with your normal soap after using and rinsing off the CHG Soap.  9. Pat yourself dry with a CLEAN TOWEL.  10. Wear CLEAN PAJAMAS to bed the night before surgery, wear comfortable clothes the morning of surgery  11. Place CLEAN SHEETS on your bed the night of your first shower and DO NOT SLEEP WITH PETS.  Day of Surgery: Shower as instructed above. Do not apply any deodorants/lotions, powders or colognes.  Please wear clean  clothes to the hospital/surgery center.   Remember to brush your teeth WITH YOUR REGULAR TOOTHPASTE.  Do not wear jewelry, make-up or nail polish.  Do not shave 48 hours prior to surgery.  Men may shave face and neck.  Do not bring valuables to the hospital.  [facility] is not responsible for any belongings or valuables.  Contacts, dentures or bridgework may not be worn into surgery.  Leave your suitcase in the car.  After surgery it may be brought to your room.  For patients admitted to the hospital, discharge time will be determined by your treatment team.  Patients discharged the day of surgery will not be allowed to drive home.   Please read over the fact sheets that you were given.

## 2020-05-11 NOTE — Anesthesia Preprocedure Evaluation (Addendum)
Anesthesia Evaluation  Patient identified by MRN, date of birth, ID band Patient awake    Reviewed: Allergy & Precautions, NPO status , Patient's Chart, lab work & pertinent test results  Airway Mallampati: II  TM Distance: >3 FB Neck ROM: Limited    Dental  (+) Upper Dentures, Lower Dentures   Pulmonary COPD,  COPD inhaler, Current Smoker,    Pulmonary exam normal        Cardiovascular hypertension, Pt. on medications  Rhythm:Regular Rate:Normal     Neuro/Psych Depression Bipolar Disorder negative neurological ROS     GI/Hepatic Neg liver ROS, GERD  Medicated,  Endo/Other  negative endocrine ROS  Renal/GU negative Renal ROS  negative genitourinary   Musculoskeletal  (+) Arthritis , Osteoarthritis,    Abdominal (+)  Abdomen: soft. Bowel sounds: normal.  Peds  Hematology negative hematology ROS (+)   Anesthesia Other Findings   Reproductive/Obstetrics                            Anesthesia Physical Anesthesia Plan  ASA: II  Anesthesia Plan: General   Post-op Pain Management:    Induction: Intravenous  PONV Risk Score and Plan: 1 and Ondansetron, Dexamethasone, Midazolam and Treatment may vary due to age or medical condition  Airway Management Planned: Mask and Oral ETT  Additional Equipment: None  Intra-op Plan:   Post-operative Plan: Extubation in OR  Informed Consent: I have reviewed the patients History and Physical, chart, labs and discussed the procedure including the risks, benefits and alternatives for the proposed anesthesia with the patient or authorized representative who has indicated his/her understanding and acceptance.     Dental advisory given  Plan Discussed with: CRNA  Anesthesia Plan Comments: (Lab Results      Component                Value               Date                      WBC                      9.7                 05/11/2020                 HGB                      15.8                05/11/2020                HCT                      45.9                05/11/2020                MCV                      95.0                05/11/2020                PLT  197                 05/11/2020           Lab Results      Component                Value               Date                      NA                       139                 05/11/2020                K                        3.4 (L)             05/11/2020                CO2                      25                  05/11/2020                GLUCOSE                  92                  05/11/2020                BUN                      8                   05/11/2020                CREATININE               1.29 (H)            05/11/2020                CALCIUM                  9.1                 05/11/2020                GFRNONAA                 >60                 05/11/2020                GFRAA                                        09/21/2006            >60        The eGFR has been calculated using the MDRD equation. This calculation has not been validated in all clinical)       Anesthesia Quick Evaluation

## 2020-05-12 ENCOUNTER — Encounter (HOSPITAL_COMMUNITY): Payer: Self-pay

## 2020-05-12 ENCOUNTER — Inpatient Hospital Stay (HOSPITAL_COMMUNITY): Payer: Medicare Other | Admitting: Anesthesiology

## 2020-05-12 ENCOUNTER — Inpatient Hospital Stay (HOSPITAL_COMMUNITY): Payer: Medicare Other

## 2020-05-12 ENCOUNTER — Other Ambulatory Visit: Payer: Self-pay

## 2020-05-12 ENCOUNTER — Encounter (HOSPITAL_COMMUNITY)
Admission: RE | Disposition: A | Discharge status: Home / Self Care | Payer: Self-pay | Source: Home / Self Care | Attending: Neurosurgery

## 2020-05-12 ENCOUNTER — Inpatient Hospital Stay (HOSPITAL_COMMUNITY)
Admission: RE | Admit: 2020-05-12 | Discharge: 2020-05-12 | DRG: 030 | Disposition: A | Discharge status: Home / Self Care | Payer: Medicare Other | Attending: Neurosurgery | Admitting: Neurosurgery

## 2020-05-12 DIAGNOSIS — Z888 Allergy status to other drugs, medicaments and biological substances status: Secondary | ICD-10-CM | POA: Diagnosis not present

## 2020-05-12 DIAGNOSIS — Z7951 Long term (current) use of inhaled steroids: Secondary | ICD-10-CM | POA: Diagnosis not present

## 2020-05-12 DIAGNOSIS — Z20822 Contact with and (suspected) exposure to covid-19: Secondary | ICD-10-CM | POA: Diagnosis not present

## 2020-05-12 DIAGNOSIS — M4802 Spinal stenosis, cervical region: Secondary | ICD-10-CM | POA: Diagnosis not present

## 2020-05-12 DIAGNOSIS — M1990 Unspecified osteoarthritis, unspecified site: Secondary | ICD-10-CM | POA: Diagnosis present

## 2020-05-12 DIAGNOSIS — I10 Essential (primary) hypertension: Secondary | ICD-10-CM | POA: Diagnosis not present

## 2020-05-12 DIAGNOSIS — F17210 Nicotine dependence, cigarettes, uncomplicated: Secondary | ICD-10-CM | POA: Diagnosis present

## 2020-05-12 DIAGNOSIS — L905 Scar conditions and fibrosis of skin: Secondary | ICD-10-CM | POA: Diagnosis not present

## 2020-05-12 DIAGNOSIS — K219 Gastro-esophageal reflux disease without esophagitis: Secondary | ICD-10-CM | POA: Diagnosis not present

## 2020-05-12 DIAGNOSIS — M5412 Radiculopathy, cervical region: Principal | ICD-10-CM | POA: Diagnosis present

## 2020-05-12 DIAGNOSIS — M4322 Fusion of spine, cervical region: Secondary | ICD-10-CM | POA: Diagnosis not present

## 2020-05-12 DIAGNOSIS — Z972 Presence of dental prosthetic device (complete) (partial): Secondary | ICD-10-CM

## 2020-05-12 DIAGNOSIS — Z419 Encounter for procedure for purposes other than remedying health state, unspecified: Secondary | ICD-10-CM

## 2020-05-12 DIAGNOSIS — F319 Bipolar disorder, unspecified: Secondary | ICD-10-CM | POA: Diagnosis not present

## 2020-05-12 DIAGNOSIS — Z981 Arthrodesis status: Secondary | ICD-10-CM | POA: Diagnosis not present

## 2020-05-12 DIAGNOSIS — J449 Chronic obstructive pulmonary disease, unspecified: Secondary | ICD-10-CM | POA: Diagnosis present

## 2020-05-12 DIAGNOSIS — Z79899 Other long term (current) drug therapy: Secondary | ICD-10-CM | POA: Diagnosis not present

## 2020-05-12 DIAGNOSIS — M2578 Osteophyte, vertebrae: Secondary | ICD-10-CM | POA: Diagnosis present

## 2020-05-12 DIAGNOSIS — Z886 Allergy status to analgesic agent status: Secondary | ICD-10-CM

## 2020-05-12 HISTORY — DX: Radiculopathy, cervical region: M54.12

## 2020-05-12 HISTORY — PX: ANTERIOR CERVICAL DECOMP/DISCECTOMY FUSION: SHX1161

## 2020-05-12 LAB — ABO/RH: ABO/RH(D): B POS

## 2020-05-12 SURGERY — ANTERIOR CERVICAL DECOMPRESSION/DISCECTOMY FUSION 1 LEVEL/HARDWARE REMOVAL
Anesthesia: General

## 2020-05-12 MED ORDER — MIDAZOLAM HCL 2 MG/2ML IJ SOLN
INTRAMUSCULAR | Status: DC | PRN
  Administered 2020-05-12: 2 mg via INTRAVENOUS

## 2020-05-12 MED ORDER — PROPOFOL 1000 MG/100ML IV EMUL
INTRAVENOUS | Status: AC
  Filled 2020-05-12: qty 200

## 2020-05-12 MED ORDER — SODIUM CHLORIDE 0.9% FLUSH: 3.0000 mL | Freq: Two times a day (BID) | INTRAVENOUS | Status: DC

## 2020-05-12 MED ORDER — PHENYLEPHRINE HCL-NACL 10-0.9 MG/250ML-% IV SOLN
INTRAVENOUS | Status: AC
  Filled 2020-05-12: qty 250

## 2020-05-12 MED ORDER — SUCCINYLCHOLINE CHLORIDE 200 MG/10ML IV SOSY
PREFILLED_SYRINGE | INTRAVENOUS | Status: AC
  Filled 2020-05-12: qty 10

## 2020-05-12 MED ORDER — ONDANSETRON HCL 4 MG/2ML IJ SOLN: 4.0000 mg | Freq: Four times a day (QID) | INTRAMUSCULAR | Status: DC | PRN

## 2020-05-12 MED ORDER — ACETAMINOPHEN 10 MG/ML IV SOLN
INTRAVENOUS | Status: AC
  Filled 2020-05-12: qty 100

## 2020-05-12 MED ORDER — THROMBIN 5000 UNITS EX SOLR
OROMUCOSAL | Status: DC | PRN
  Administered 2020-05-12: 5 mL via TOPICAL

## 2020-05-12 MED ORDER — ACETAMINOPHEN 650 MG RE SUPP: 650.0000 mg | RECTAL | Status: DC | PRN

## 2020-05-12 MED ORDER — PHENYLEPHRINE HCL-NACL 10-0.9 MG/250ML-% IV SOLN
INTRAVENOUS | Status: DC | PRN
  Administered 2020-05-12: 40 ug/min via INTRAVENOUS

## 2020-05-12 MED ORDER — PROMETHAZINE HCL 25 MG/ML IJ SOLN: 6.2500 mg | INTRAMUSCULAR | Status: DC | PRN

## 2020-05-12 MED ORDER — LACTATED RINGERS IV SOLN: INTRAVENOUS | Status: DC | PRN

## 2020-05-12 MED ORDER — PROPOFOL 10 MG/ML IV BOLUS
INTRAVENOUS | Status: AC
  Filled 2020-05-12: qty 20

## 2020-05-12 MED ORDER — SODIUM CHLORIDE 0.9% FLUSH: 3.0000 mL | INTRAVENOUS | Status: DC | PRN

## 2020-05-12 MED ORDER — CEFAZOLIN SODIUM-DEXTROSE 2-4 GM/100ML-% IV SOLN: 2.0000 g | Freq: Three times a day (TID) | INTRAVENOUS | Status: DC

## 2020-05-12 MED ORDER — PROPOFOL 10 MG/ML IV BOLUS
INTRAVENOUS | Status: DC | PRN
  Administered 2020-05-12: 30 mg via INTRAVENOUS
  Administered 2020-05-12: 170 mg via INTRAVENOUS

## 2020-05-12 MED ORDER — DEXAMETHASONE SODIUM PHOSPHATE 10 MG/ML IJ SOLN
INTRAMUSCULAR | Status: DC | PRN
  Administered 2020-05-12: 10 mg via INTRAVENOUS

## 2020-05-12 MED ORDER — ACETAMINOPHEN 10 MG/ML IV SOLN
INTRAVENOUS | Status: DC | PRN
  Administered 2020-05-12: 1000 mg via INTRAVENOUS

## 2020-05-12 MED ORDER — LIDOCAINE 2% (20 MG/ML) 5 ML SYRINGE
INTRAMUSCULAR | Status: DC | PRN
  Administered 2020-05-12: 4 mL via INTRAVENOUS
  Administered 2020-05-12: 2 mL via INTRAVENOUS

## 2020-05-12 MED ORDER — CEFAZOLIN SODIUM-DEXTROSE 2-3 GM-%(50ML) IV SOLR
INTRAVENOUS | Status: DC | PRN
  Administered 2020-05-12: 2 g via INTRAVENOUS

## 2020-05-12 MED ORDER — MENTHOL 3 MG MT LOZG: 1.0000 | LOZENGE | OROMUCOSAL | Status: DC | PRN

## 2020-05-12 MED ORDER — LIDOCAINE-EPINEPHRINE 1 %-1:100000 IJ SOLN
INTRAMUSCULAR | Status: AC
  Filled 2020-05-12: qty 1

## 2020-05-12 MED ORDER — CHLORHEXIDINE GLUCONATE 0.12 % MT SOLN
15.0000 mL | Freq: Once | OROMUCOSAL | Status: AC
  Administered 2020-05-12: 15 mL via OROMUCOSAL
  Filled 2020-05-12: qty 15

## 2020-05-12 MED ORDER — ROCURONIUM BROMIDE 10 MG/ML (PF) SYRINGE
PREFILLED_SYRINGE | INTRAVENOUS | Status: DC | PRN
  Administered 2020-05-12 (×3): 10 mg via INTRAVENOUS
  Administered 2020-05-12: 60 mg via INTRAVENOUS

## 2020-05-12 MED ORDER — MORPHINE SULFATE (PF) 2 MG/ML IV SOLN: 2.0000 mg | INTRAVENOUS | Status: DC | PRN

## 2020-05-12 MED ORDER — ONDANSETRON HCL 4 MG/2ML IJ SOLN
INTRAMUSCULAR | Status: DC | PRN
  Administered 2020-05-12: 4 mg via INTRAVENOUS

## 2020-05-12 MED ORDER — LIDOCAINE-EPINEPHRINE 1 %-1:100000 IJ SOLN
INTRAMUSCULAR | Status: DC | PRN
  Administered 2020-05-12: 4 mL

## 2020-05-12 MED ORDER — SUGAMMADEX SODIUM 200 MG/2ML IV SOLN
INTRAVENOUS | Status: DC | PRN
  Administered 2020-05-12: 200 mg via INTRAVENOUS

## 2020-05-12 MED ORDER — MIDAZOLAM HCL 2 MG/2ML IJ SOLN
INTRAMUSCULAR | Status: AC
  Filled 2020-05-12: qty 2

## 2020-05-12 MED ORDER — ONDANSETRON HCL 4 MG/2ML IJ SOLN
INTRAMUSCULAR | Status: AC
  Filled 2020-05-12: qty 2

## 2020-05-12 MED ORDER — OXYCODONE HCL 5 MG/5ML PO SOLN
5.0000 mg | Freq: Once | ORAL | Status: AC | PRN
Start: 2020-05-12 — End: 2020-05-12

## 2020-05-12 MED ORDER — HYDROMORPHONE HCL 1 MG/ML IJ SOLN
INTRAMUSCULAR | Status: AC
  Filled 2020-05-12: qty 1

## 2020-05-12 MED ORDER — ACETAMINOPHEN 325 MG PO TABS: 650.0000 mg | ORAL_TABLET | ORAL | Status: DC | PRN

## 2020-05-12 MED ORDER — SODIUM CHLORIDE 0.9 % IV SOLN: 250.0000 mL | INTRAVENOUS | Status: DC

## 2020-05-12 MED ORDER — LACTATED RINGERS IV SOLN: INTRAVENOUS | Status: DC

## 2020-05-12 MED ORDER — THROMBIN 5000 UNITS EX SOLR
CUTANEOUS | Status: AC
  Filled 2020-05-12: qty 5000

## 2020-05-12 MED ORDER — ACETAMINOPHEN 10 MG/ML IV SOLN: 1000.0000 mg | Freq: Once | INTRAVENOUS | Status: DC | PRN

## 2020-05-12 MED ORDER — FENTANYL CITRATE (PF) 250 MCG/5ML IJ SOLN
INTRAMUSCULAR | Status: AC
  Filled 2020-05-12: qty 5

## 2020-05-12 MED ORDER — HYDROMORPHONE HCL 1 MG/ML IJ SOLN
0.2500 mg | INTRAMUSCULAR | Status: DC | PRN
  Administered 2020-05-12: 0.5 mg via INTRAVENOUS
  Administered 2020-05-12 (×2): 0.25 mg via INTRAVENOUS
  Administered 2020-05-12 (×2): 0.5 mg via INTRAVENOUS

## 2020-05-12 MED ORDER — ONDANSETRON HCL 4 MG PO TABS: 4.0000 mg | ORAL_TABLET | Freq: Four times a day (QID) | ORAL | Status: DC | PRN

## 2020-05-12 MED ORDER — FENTANYL CITRATE (PF) 250 MCG/5ML IJ SOLN
INTRAMUSCULAR | Status: DC | PRN
  Administered 2020-05-12 (×2): 50 ug via INTRAVENOUS
  Administered 2020-05-12: 100 ug via INTRAVENOUS
  Administered 2020-05-12: 50 ug via INTRAVENOUS

## 2020-05-12 MED ORDER — PROPOFOL 1000 MG/100ML IV EMUL
INTRAVENOUS | Status: AC
  Filled 2020-05-12: qty 100

## 2020-05-12 MED ORDER — OXYCODONE HCL 5 MG PO TABS
ORAL_TABLET | ORAL | Status: AC
  Filled 2020-05-12: qty 1

## 2020-05-12 MED ORDER — ALBUTEROL SULFATE HFA 108 (90 BASE) MCG/ACT IN AERS
INHALATION_SPRAY | RESPIRATORY_TRACT | Status: DC | PRN
  Administered 2020-05-12: 4 via RESPIRATORY_TRACT

## 2020-05-12 MED ORDER — 0.9 % SODIUM CHLORIDE (POUR BTL) OPTIME
TOPICAL | Status: DC | PRN
  Administered 2020-05-12: 1000 mL

## 2020-05-12 MED ORDER — DEXAMETHASONE SODIUM PHOSPHATE 10 MG/ML IJ SOLN
INTRAMUSCULAR | Status: AC
  Filled 2020-05-12: qty 1

## 2020-05-12 MED ORDER — CEFAZOLIN SODIUM-DEXTROSE 2-4 GM/100ML-% IV SOLN
INTRAVENOUS | Status: AC
  Filled 2020-05-12: qty 100

## 2020-05-12 MED ORDER — LIDOCAINE 2% (20 MG/ML) 5 ML SYRINGE
INTRAMUSCULAR | Status: AC
  Filled 2020-05-12: qty 5

## 2020-05-12 MED ORDER — OXYCODONE HCL 5 MG PO TABS
5.0000 mg | ORAL_TABLET | Freq: Once | ORAL | Status: AC | PRN
Start: 2020-05-12 — End: 2020-05-12
  Administered 2020-05-12: 5 mg via ORAL

## 2020-05-12 MED ORDER — PHENOL 1.4 % MT LIQD: 1.0000 | OROMUCOSAL | Status: DC | PRN

## 2020-05-12 MED ORDER — ORAL CARE MOUTH RINSE: 15.0000 mL | Freq: Once | OROMUCOSAL | Status: AC

## 2020-05-12 SURGICAL SUPPLY — 72 items
APL SKNCLS STERI-STRIP NONHPOA (GAUZE/BANDAGES/DRESSINGS) ×1
BAND INSRT 18 STRL LF DISP RB (MISCELLANEOUS) ×2
BAND RUBBER #18 3X1/16 STRL (MISCELLANEOUS) ×6 IMPLANT
BASKET BONE COLLECTION (BASKET) IMPLANT
BENZOIN TINCTURE PRP APPL 2/3 (GAUZE/BANDAGES/DRESSINGS) ×3 IMPLANT
BIT DRILL 13 (BIT) ×1 IMPLANT
BIT DRILL 13MM (BIT) ×1
BIT DRILL NEURO 2X3.1 SFT TUCH (MISCELLANEOUS) ×1 IMPLANT
BLADE CLIPPER SURG (BLADE) ×2 IMPLANT
BLADE SURG 15 STRL LF DISP TIS (BLADE) IMPLANT
BLADE SURG 15 STRL SS (BLADE)
BLADE ULTRA TIP 2M (BLADE) IMPLANT
BUR MATCHSTICK NEURO 3.0 LAGG (BURR) ×3 IMPLANT
CANISTER SUCT 3000ML PPV (MISCELLANEOUS) ×3 IMPLANT
CLOSURE WOUND 1/2 X4 (GAUZE/BANDAGES/DRESSINGS) ×1
COLLAR CERV LO CONTOUR FIRM DE (SOFTGOODS) ×3 IMPLANT
COVER WAND RF STERILE (DRAPES) ×3 IMPLANT
DECANTER SPIKE VIAL GLASS SM (MISCELLANEOUS) ×3 IMPLANT
DEVICE ENDSKLTN IMPL 16X14X7X6 (Cage) IMPLANT
DRAPE C-ARM 42X72 X-RAY (DRAPES) ×6 IMPLANT
DRAPE LAPAROTOMY 100X72 PEDS (DRAPES) ×3 IMPLANT
DRAPE MICROSCOPE LEICA (MISCELLANEOUS) ×3 IMPLANT
DRAPE SHEET LG 3/4 BI-LAMINATE (DRAPES) ×3 IMPLANT
DRILL NEURO 2X3.1 SOFT TOUCH (MISCELLANEOUS) ×3
DRSG OPSITE 4X5.5 SM (GAUZE/BANDAGES/DRESSINGS) ×6 IMPLANT
DRSG OPSITE POSTOP 3X4 (GAUZE/BANDAGES/DRESSINGS) ×3 IMPLANT
DRSG OPSITE POSTOP 4X6 (GAUZE/BANDAGES/DRESSINGS) ×2 IMPLANT
DURAPREP 26ML APPLICATOR (WOUND CARE) ×3 IMPLANT
ELECT COATED BLADE 2.86 ST (ELECTRODE) ×3 IMPLANT
ELECT REM PT RETURN 9FT ADLT (ELECTROSURGICAL) ×3
ELECTRODE REM PT RTRN 9FT ADLT (ELECTROSURGICAL) ×1 IMPLANT
ENDOSKELETON IMPLANT 16X14X7X6 (Cage) ×3 IMPLANT
GAUZE 4X4 16PLY RFD (DISPOSABLE) IMPLANT
GLOVE BIOGEL PI IND STRL 7.5 (GLOVE) ×1 IMPLANT
GLOVE BIOGEL PI INDICATOR 7.5 (GLOVE) ×2
GLOVE ECLIPSE 7.5 STRL STRAW (GLOVE) ×3 IMPLANT
GLOVE SRG 8 PF TXTR STRL LF DI (GLOVE) IMPLANT
GLOVE SURG UNDER POLY LF SZ6.5 (GLOVE) ×8 IMPLANT
GLOVE SURG UNDER POLY LF SZ8 (GLOVE) ×6
GOWN STRL REUS W/ TWL LRG LVL3 (GOWN DISPOSABLE) ×2 IMPLANT
GOWN STRL REUS W/ TWL XL LVL3 (GOWN DISPOSABLE) ×1 IMPLANT
GOWN STRL REUS W/TWL 2XL LVL3 (GOWN DISPOSABLE) IMPLANT
GOWN STRL REUS W/TWL LRG LVL3 (GOWN DISPOSABLE) ×6
GOWN STRL REUS W/TWL XL LVL3 (GOWN DISPOSABLE) ×3
HEMOSTAT POWDER KIT SURGIFOAM (HEMOSTASIS) ×3 IMPLANT
KIT BASIN OR (CUSTOM PROCEDURE TRAY) ×3 IMPLANT
KIT TURNOVER KIT B (KITS) ×3 IMPLANT
NDL SPNL 22GX3.5 QUINCKE BK (NEEDLE) ×1 IMPLANT
NEEDLE HYPO 22GX1.5 SAFETY (NEEDLE) ×3 IMPLANT
NEEDLE SPNL 22GX3.5 QUINCKE BK (NEEDLE) ×3 IMPLANT
NS IRRIG 1000ML POUR BTL (IV SOLUTION) ×3 IMPLANT
PACK LAMINECTOMY NEURO (CUSTOM PROCEDURE TRAY) ×3 IMPLANT
PAD ARMBOARD 7.5X6 YLW CONV (MISCELLANEOUS) ×3 IMPLANT
PATTIES SURGICAL .5 X3 (DISPOSABLE) ×3 IMPLANT
PIN DISTRACTION 14MM (PIN) ×6 IMPLANT
PLATE ZEVO 1LVL 19MM (Plate) ×2 IMPLANT
PUTTY DBF 1CC CORTICAL FIBERS (Putty) ×2 IMPLANT
SCREW 3.5 SELFDRILL 15MM VARI (Screw) ×8 IMPLANT
SLEEVE SURGEON STRL (DRAPES) ×2 IMPLANT
SPONGE INTESTINAL PEANUT (DISPOSABLE) ×3 IMPLANT
SPONGE SURGIFOAM ABS GEL SZ50 (HEMOSTASIS) IMPLANT
STAPLER VISISTAT 35W (STAPLE) IMPLANT
STRIP CLOSURE SKIN 1/2X4 (GAUZE/BANDAGES/DRESSINGS) ×2 IMPLANT
SUT MNCRL AB 4-0 PS2 18 (SUTURE) ×3 IMPLANT
SUT SILK 2 0 TIES 10X30 (SUTURE) IMPLANT
SUT VIC AB 0 CT1 27 (SUTURE)
SUT VIC AB 0 CT1 27XBRD ANTBC (SUTURE) IMPLANT
SUT VIC AB 2-0 CP2 18 (SUTURE) ×3 IMPLANT
TAPE CLOTH 3X10 TAN LF (GAUZE/BANDAGES/DRESSINGS) ×3 IMPLANT
TOWEL GREEN STERILE (TOWEL DISPOSABLE) ×3 IMPLANT
TOWEL GREEN STERILE FF (TOWEL DISPOSABLE) ×3 IMPLANT
WATER STERILE IRR 1000ML POUR (IV SOLUTION) ×3 IMPLANT

## 2020-05-12 NOTE — Progress Notes (Signed)
Orthopedic Tech Progress Note Patient Details:  Jimmy Morrison 1965/06/21 [id number]  Ortho Devices Type of Ortho Device: Soft collar Ortho Device/Splint Location: NECK Ortho Device/Splint Interventions: Application,Adjustment,Ordered   Post Interventions Patient Tolerated: Well Instructions Provided: Care of device   Janit Pagan 05/12/2020, 12:25 PM

## 2020-05-12 NOTE — Transfer of Care (Signed)
Immediate Anesthesia Transfer of Care Note  Patient: Jimmy Morrison  Procedure(s) Performed: Anterior Cervical Decompression Fusion - Cervical five-Cervical six, removal of Cervical six-seven plate (N/A )  Patient Location: PACU  Anesthesia Type:General  Level of Consciousness: drowsy and patient cooperative  Airway & Oxygen Therapy: Patient Spontanous Breathing and Patient connected to nasal cannula oxygen  Post-op Assessment: Report given to RN and Post -op Vital signs reviewed and stable  Post vital signs: Reviewed and stable  Last Vitals:  Vitals Value Taken Time  BP 131/70 05/12/20 1146  Temp    Pulse 71 05/12/20 1147  Resp 18 05/12/20 1147  SpO2 91 % 05/12/20 1147  Vitals shown include unvalidated device data.  Last Pain:  Vitals:   05/12/20 0635  TempSrc: Oral  PainSc: 10-Worst pain ever      Patients Stated Pain Goal: 2 (53/74/82 7078)  Complications: No complications documented.

## 2020-05-12 NOTE — Anesthesia Postprocedure Evaluation (Signed)
Anesthesia Post Note  Patient: Jimmy Morrison  Procedure(s) Performed: Anterior Cervical Decompression Fusion - Cervical five-Cervical six, removal of Cervical six-seven plate (N/A )     Patient location during evaluation: PACU Anesthesia Type: General Level of consciousness: awake and alert Pain management: pain level controlled Vital Signs Assessment: post-procedure vital signs reviewed and stable Respiratory status: spontaneous breathing, nonlabored ventilation, respiratory function stable and patient connected to nasal cannula oxygen Cardiovascular status: blood pressure returned to baseline and stable Postop Assessment: no apparent nausea or vomiting Anesthetic complications: no   No complications documented.  Last Vitals:  Vitals:   05/12/20 1316 05/12/20 1520  BP: 118/77 128/60  Pulse: 65 62  Resp: 13 16  Temp: (!) 36.4 C 36.4 C  SpO2: 100% 93%    Last Pain:  Vitals:   05/12/20 1520  TempSrc:   PainSc: 2                  [name]

## 2020-05-12 NOTE — Progress Notes (Signed)
Pt has been in phase 1 observation since 1145. Pt's pain is within a tolerable limit. Pt ambulated from bay 5 to the restroom, voided, and ambulated to bay 16 isolation room. Pt in NAD, resting in chair. Regular lunch bag tray ordered. Pt does not require VS monitoring at this time, waiting in bay 16 until we get OK from Dr. Marcello Moores for discharge.

## 2020-05-12 NOTE — Anesthesia Procedure Notes (Signed)
Procedure Name: Intubation Date/Time: 05/12/2020 9:07 AM Performed by: Kathryne Hitch, CRNA Pre-anesthesia Checklist: Patient identified, Suction available, Emergency Drugs available and Patient being monitored Patient Re-evaluated:Patient Re-evaluated prior to induction Oxygen Delivery Method: Circle system utilized Preoxygenation: Pre-oxygenation with 100% oxygen Induction Type: IV induction Ventilation: Two handed mask ventilation required and Oral airway inserted - appropriate to patient size Laryngoscope Size: Glidescope and 4 Grade View: Grade I Tube type: Oral Tube size: 7.5 mm Number of attempts: 2 Airway Equipment and Method: Rigid stylet,  Video-laryngoscopy and Oral airway Placement Confirmation: ETT inserted through vocal cords under direct vision,  positive ETCO2 and breath sounds checked- equal and bilateral Secured at: 22 cm Tube secured with: Tape Dental Injury: Teeth and Oropharynx as per pre-operative assessment  Difficulty Due To: Difficult Airway- due to reduced neck mobility and Difficulty was anticipated

## 2020-05-12 NOTE — Op Note (Signed)
PREOP DIAGNOSIS: C6 radiculopathy, right  POSTOP DIAGNOSIS: C6 radiculopathy, right   PROCEDURE: 1. Arthrodesis C5-6, anterior interbody technique, including Discectomy for decompression of spinal cord and exiting nerve roots with foraminotomies  2. Placement of intervertebral biomechanical device C5-6 3. Placement of anterior instrumentation consisting of interbody plate and screws B7-1 4.  Removal of anterior instrumentation, C6-7 5. Use of morselized bone allograft  6. Use of intraoperative microscope  SURGEON: Dr. Duffy Rhody, MD  ASSISTANT: None  ANESTHESIA: General Endotracheal  EBL: 25 mL  IMPLANTS: [employer] 7 x 16 x 14 mm Titan C cage 19 mm Zevo plate 15 mm screws  SPECIMENS: None  DRAINS: None  COMPLICATIONS: None immediate  CONDITION: Hemodynamically stable to PACU  HISTORY: Jimmy Morrison is a 55 y.o. y.o. male with a history of C6-7 ACDF who developed progressive right C6 radiculopathy with pain and weakness.  This progressed despite nonsurgical therapies.  I had a long discussion with the patient regarding treatment options.  Risks, benefits, alternatives, and expected convalescence were discussed with the patient.  Risks discussed included but were not limited to bleeding, pain, infection, pseudoarthrosis, hardware failure, adjacent segment disease, CSF leak, neurologic deficits, weakness, numbness, paralysis, coma, and death. After all questions were answered, informed consent was obtained.  Patient also stated he wished for his previous anterior cervical plate to be removed.  I explained that this may incur some additional risk and dissection but if technically feasible during surgery, we would do this.    PROCEDURE IN DETAIL: The patient was brought to the operating room and transferred to the operative table. After induction of general anesthesia, the patient was positioned on the operative table in the supine position with all pressure points  meticulously padded. The skin of the neck was then prepped and draped in the usual sterile fashion.  After timeout was conducted, the skin was infiltrated with local anesthetic. Skin incision was then made sharply and Bovie electrocautery was used to dissect the subcutaneous tissue until the platysma was identified. The platysma was then divided and undermined. The sternocleidomastoid muscle was then identified and, utilizing natural fascial planes in the neck, the prevertebral fascia was identified and the carotid sheath was retracted laterally and the trachea and esophagus retracted medially.  There was a fair amount of scar that required dissection, and the thick eschar was in the prevertebral space.  This was incised sharply and a large osteophyte over the C5-6 disc space was identified.  Dissection proceeded inferiorly and the C6-7 cervical plate was seen, partially embedded in bone.  C-arm x-ray confirmed appropriate disc space localization.  Bovie electrocautery was used to dissect in the subperiosteal plane and elevate the bilateral longus coli muscles.  Large anterior osteophyte at C5-6 was removed to expose the disc space as well as the superior part of the previous plate.  Bone that had overgrown over the plate was drilled away.  The previous screws were then removed after loosening the final tightening mechanism and the plate was removed.  Self-retaining retractors were then placed.  Caspar distraction pins were placed in the adjacent bodies of C5 and C6 to allow for gentle distraction.  At this point, the microscope was draped and brought into the field, and the remainder of the case was done under the microscope using microdissecting technique.  The disc space was incised sharply and combination of high speed drill, curettes, and rongeurs were use to initially complete a discectomy. The high-speed drill was then used to complete discectomy  until the posterior annulus was identified and removed and  the posterior longitudinal ligament was identified. Using a nerve hook, the PLL was elevated, and Kerrison rongeurs were used to remove the posterior longitudinal ligament and the ventral thecal sac was identified. Using a combination of curettes and rongeurs, complete decompression of the thecal sac and exiting nerve roots at this level was completed, with particular attention paid to the right side where his symptoms were arising.  Good decompression was verified with easy passage of micro-nerve hook centrally and in the bilateral foramina.  Having completed our decompression, attention was turned to placement of the intervertebral device.  The interbody space and endplates were prepared and remaining cartilage removed.  Trial spacers were used to select a size 7 mm graft. This graft was then filled with morcellized allograft, and inserted under live fluoroscopy.  After placement of the intervertebral device, the caspar pins were removed.  An anterior cervical plate was placed across the interspaces for anterior fixation.  Using a high-speed drill, the cortex of the cervical vertebral bodies was punctured, and screws inserted in the vertebral bodies. Final fluoroscopic images in AP and lateral projections were taken to confirm good hardware placement.  At this point, after all counts were verified to be correct, meticulous hemostasis was secured using a combination of bipolar electrocautery and passive hemostatics. The platysma muscle was then closed using interrupted 3-0 Vicryl sutures, and the skin was closed with a 4-0 monocryl in subcutical fashion. Sterile dressings were then applied and the drapes removed.  The patient tolerated the procedure well and was extubated in the room and taken to the postanesthesia care unit in stable condition.  All counts were correct at the end of the procedure.

## 2020-05-12 NOTE — H&P (Signed)
CC: right arm pain  HPI:     Patient is a 55 y.o. male with hx of C6-7 ACDF developed adjacent segment dz and stenosis with severe right arm radiculopathy refractory to non-surgical measures.      There are no problems to display for this patient.  Past Medical History:  Diagnosis Date  . Arthritis   . Bipolar disorder ([place])   . COPD (chronic obstructive pulmonary disease) ([place])   . Depression   . GERD (gastroesophageal reflux disease)   . Hypertension     Past Surgical History:  Procedure Laterality Date  . ANTERIOR FUSION CERVICAL SPINE    . BACK SURGERY    . COLONOSCOPY W/ POLYPECTOMY    . SHOULDER ARTHROSCOPY Left    cleaned up after accident    Medications Prior to Admission  Medication Sig Dispense Refill Last Dose  . acetaminophen (TYLENOL) 650 MG CR tablet Take 1,300 mg by mouth every 8 (eight) hours as needed for pain.   Past Week at Unknown time  . albuterol (VENTOLIN HFA) 108 (90 Base) MCG/ACT inhaler Inhale 2 puffs into the lungs 2 (two) times daily.   05/12/2020 at 0430  . celecoxib (CELEBREX) 200 MG capsule Take 200 mg by mouth every morning.   Past Week at Unknown time  . DULoxetine (CYMBALTA) 60 MG capsule Take 60 mg by mouth every morning.   05/12/2020 at 0430  . ezetimibe (ZETIA) 10 MG tablet Take 10 mg by mouth at bedtime.   05/11/2020 at Unknown time  . fenofibrate (TRICOR) 145 MG tablet Take 145 mg by mouth at bedtime.   05/11/2020 at Unknown time  . fluticasone (FLONASE) 50 MCG/ACT nasal spray Place 1-2 sprays into both nostrils daily as needed for allergies or rhinitis.   Past Week at Unknown time  . montelukast (SINGULAIR) 10 MG tablet Take 10 mg by mouth every morning.   05/11/2020 at Unknown time  . omeprazole (PRILOSEC) 40 MG capsule Take 40 mg by mouth 2 (two) times daily.   05/11/2020 at Unknown time  . SPIRIVA RESPIMAT 1.25 MCG/ACT AERS Inhale 2 puffs into the lungs daily.   05/12/2020 at Unknown time  . tiZANidine (ZANAFLEX) 2 MG tablet Take 2 mg by  mouth at bedtime.   05/11/2020 at Unknown time  . traZODone (DESYREL) 100 MG tablet Take 200 mg by mouth at bedtime.   05/11/2020 at Unknown time  . valsartan (DIOVAN) 80 MG tablet Take 80 mg by mouth every morning.   05/11/2020 at Unknown time  . VASCEPA 1 g capsule Take 2 g by mouth 2 (two) times daily.   05/11/2020 at Unknown time  . oxyCODONE-acetaminophen (PERCOCET/ROXICET) 5-325 MG tablet Take 1 tablet by mouth every 6 (six) hours as needed for severe pain. (Patient not taking: No sig reported) 20 tablet 0 Completed Course at Unknown time   Allergies  Allergen Reactions  . Pregabalin     Bumps in mouth  . Aspirin     Nose bleed  . Bupropion     Bumps in mouth  . Prednisone     Mood changes    Social History   Tobacco Use  . Smoking status: Current Every Day Smoker    Packs/day: 1.00    Years: 35.00    Pack years: 35.00  . Smokeless tobacco: Never Used  Substance Use Topics  . Alcohol use: Not Currently    History reviewed. No pertinent family history.   Review of Systems Pertinent items noted in HPI  and remainder of comprehensive ROS otherwise negative.  Objective:   Patient Vitals for the past 8 hrs:  BP Temp Temp src Pulse Resp SpO2 Height Weight  05/12/20 0635 (!) 150/82 98 F (36.7 C) Oral 62 18 96 % 5\' 9"  (1.753 m) 95.3 kg   No intake/output data recorded. No intake/output data recorded.      General : Alert, cooperative, no distress, appears stated age   Head:  Normocephalic/atraumatic    Eyes: PERRL, conjunctiva/corneas clear, EOM's intact. Fundi could not be visualized Neck: Supple Chest:  Respirations unlabored Chest wall: no tenderness or deformity Heart: Regular rate and rhythm Abdomen: Soft, nontender and nondistended Extremities: warm and well-perfused Skin: normal turgor, color and texture Neurologic:  Alert, oriented x 3.  Eyes open spontaneously. PERRL, EOMI, VFC, no facial droop. V1-3 intact.  No dysarthria, tongue protrusion symmetric.   CNII-XII intact. 4/5 Biceps, Wext, otw 5/5 strength, sensation and reflexes throughout.  No pronator drift, full strength in legs.  + R Spurling's.        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

## 2020-05-13 ENCOUNTER — Encounter (HOSPITAL_COMMUNITY): Payer: Self-pay | Admitting: Neurosurgery

## 2020-05-17 NOTE — Discharge Summary (Signed)
  Physician Discharge Summary  Patient ID: Jimmy Morrison MRN: [id number] DOB/AGE: 04-09-1965 55 y.o.  Admit date: 05/12/2020 Discharge date: 05/12/2020  Admission Diagnoses:  Cervical radiculopathy  Discharge Diagnoses:  Same Active Problems:   Radiculopathy of cervical spine   Discharged Condition: Stable  Hospital Course:  Jimmy Morrison is a 55 y.o. male   Who developed severe cervical radiculopathy related to adjacent segment disease.  He underwent elective C5-6 ACDF and removal of C6-7 anterior instrumentation.  Postoperatively in the PACU, he had good resolution of his radiculopathy symptoms.  He was deemed ready for discharge home 4/13.   Discharge Exam: Blood pressure 128/60, pulse 62, temperature 97.6 F (36.4 C), resp. rate 16, height 5\' 9"  (1.753 m), weight 95.3 kg, SpO2 93 %. Awake, alert, oriented Speech fluent, appropriate CN grossly intact 5/5 BUE/BLE Wound c/d/i  Disposition: Discharge disposition: 01-Home or Self Care       Discharge Instructions    Incentive spirometry RT   Complete by: As directed      Allergies as of 05/12/2020      Reactions   Pregabalin    Bumps in mouth   Aspirin    Nose bleed   Bupropion    Bumps in mouth   Prednisone    Mood changes      Medication List    STOP taking these medications   celecoxib 200 MG capsule Commonly known as: CELEBREX     TAKE these medications   acetaminophen 650 MG CR tablet Commonly known as: TYLENOL Take 1,300 mg by mouth every 8 (eight) hours as needed for pain.   albuterol 108 (90 Base) MCG/ACT inhaler Commonly known as: VENTOLIN HFA Inhale 2 puffs into the lungs 2 (two) times daily.   DULoxetine 60 MG capsule Commonly known as: CYMBALTA Take 60 mg by mouth every morning.   ezetimibe 10 MG tablet Commonly known as: ZETIA Take 10 mg by mouth at bedtime.   fenofibrate 145 MG tablet Commonly known as: TRICOR Take 145 mg by mouth at bedtime.   fluticasone 50 MCG/ACT  nasal spray Commonly known as: FLONASE Place 1-2 sprays into both nostrils daily as needed for allergies or rhinitis.   montelukast 10 MG tablet Commonly known as: SINGULAIR Take 10 mg by mouth every morning.   omeprazole 40 MG capsule Commonly known as: PRILOSEC Take 40 mg by mouth 2 (two) times daily.   oxyCODONE-acetaminophen 5-325 MG tablet Commonly known as: PERCOCET/ROXICET Take 1 tablet by mouth every 6 (six) hours as needed for severe pain.   Spiriva Respimat 1.25 MCG/ACT Aers Generic drug: Tiotropium Bromide Monohydrate Inhale 2 puffs into the lungs daily.   tiZANidine 2 MG tablet Commonly known as: ZANAFLEX Take 2 mg by mouth at bedtime.   traZODone 100 MG tablet Commonly known as: DESYREL Take 200 mg by mouth at bedtime.   valsartan 80 MG tablet Commonly known as: DIOVAN Take 80 mg by mouth every morning.   Vascepa 1 g capsule Generic drug: icosapent Ethyl Take 2 g by mouth 2 (two) times daily.        Signed: Vallarie Mare 05/17/2020, 6:00 PM

## 2020-05-18 DIAGNOSIS — Z9889 Other specified postprocedural states: Secondary | ICD-10-CM | POA: Diagnosis not present

## 2020-05-18 DIAGNOSIS — Z720 Tobacco use: Secondary | ICD-10-CM | POA: Diagnosis not present

## 2020-05-28 DIAGNOSIS — Z981 Arthrodesis status: Secondary | ICD-10-CM

## 2020-05-28 HISTORY — DX: Arthrodesis status: Z98.1

## 2020-05-29 DIAGNOSIS — I10 Essential (primary) hypertension: Secondary | ICD-10-CM | POA: Diagnosis not present

## 2020-05-29 DIAGNOSIS — K219 Gastro-esophageal reflux disease without esophagitis: Secondary | ICD-10-CM | POA: Diagnosis not present

## 2020-05-29 DIAGNOSIS — E785 Hyperlipidemia, unspecified: Secondary | ICD-10-CM | POA: Diagnosis not present

## 2020-06-08 DIAGNOSIS — Z981 Arthrodesis status: Secondary | ICD-10-CM | POA: Diagnosis not present

## 2020-06-08 DIAGNOSIS — M5412 Radiculopathy, cervical region: Secondary | ICD-10-CM | POA: Diagnosis not present

## 2020-06-29 DIAGNOSIS — Z981 Arthrodesis status: Secondary | ICD-10-CM | POA: Insufficient documentation

## 2020-06-29 HISTORY — DX: Arthrodesis status: Z98.1

## 2020-08-29 DIAGNOSIS — I10 Essential (primary) hypertension: Secondary | ICD-10-CM | POA: Diagnosis not present

## 2020-08-29 DIAGNOSIS — K219 Gastro-esophageal reflux disease without esophagitis: Secondary | ICD-10-CM | POA: Diagnosis not present

## 2020-08-29 DIAGNOSIS — E785 Hyperlipidemia, unspecified: Secondary | ICD-10-CM | POA: Diagnosis not present

## 2020-09-28 DIAGNOSIS — G5603 Carpal tunnel syndrome, bilateral upper limbs: Secondary | ICD-10-CM | POA: Diagnosis not present

## 2020-09-28 DIAGNOSIS — Z981 Arthrodesis status: Secondary | ICD-10-CM | POA: Diagnosis not present

## 2020-09-28 DIAGNOSIS — R030 Elevated blood-pressure reading, without diagnosis of hypertension: Secondary | ICD-10-CM | POA: Insufficient documentation

## 2020-09-28 DIAGNOSIS — M5412 Radiculopathy, cervical region: Secondary | ICD-10-CM | POA: Diagnosis not present

## 2020-09-28 HISTORY — DX: Elevated blood-pressure reading, without diagnosis of hypertension: R03.0

## 2020-09-29 DIAGNOSIS — K219 Gastro-esophageal reflux disease without esophagitis: Secondary | ICD-10-CM | POA: Diagnosis not present

## 2020-09-29 DIAGNOSIS — I10 Essential (primary) hypertension: Secondary | ICD-10-CM | POA: Diagnosis not present

## 2020-09-29 DIAGNOSIS — E785 Hyperlipidemia, unspecified: Secondary | ICD-10-CM | POA: Diagnosis not present

## 2020-10-26 DIAGNOSIS — G5621 Lesion of ulnar nerve, right upper limb: Secondary | ICD-10-CM | POA: Insufficient documentation

## 2020-10-26 DIAGNOSIS — G5622 Lesion of ulnar nerve, left upper limb: Secondary | ICD-10-CM | POA: Diagnosis not present

## 2020-10-26 DIAGNOSIS — G5603 Carpal tunnel syndrome, bilateral upper limbs: Secondary | ICD-10-CM | POA: Diagnosis not present

## 2020-10-26 HISTORY — DX: Lesion of ulnar nerve, right upper limb: G56.21

## 2020-10-28 DIAGNOSIS — J22 Unspecified acute lower respiratory infection: Secondary | ICD-10-CM | POA: Diagnosis not present

## 2020-11-29 DIAGNOSIS — K219 Gastro-esophageal reflux disease without esophagitis: Secondary | ICD-10-CM | POA: Diagnosis not present

## 2020-11-29 DIAGNOSIS — I10 Essential (primary) hypertension: Secondary | ICD-10-CM | POA: Diagnosis not present

## 2020-11-29 DIAGNOSIS — E782 Mixed hyperlipidemia: Secondary | ICD-10-CM | POA: Diagnosis not present

## 2020-12-22 DIAGNOSIS — R0602 Shortness of breath: Secondary | ICD-10-CM | POA: Diagnosis not present

## 2020-12-22 DIAGNOSIS — Z743 Need for continuous supervision: Secondary | ICD-10-CM | POA: Diagnosis not present

## 2020-12-22 DIAGNOSIS — R519 Headache, unspecified: Secondary | ICD-10-CM | POA: Diagnosis not present

## 2020-12-22 DIAGNOSIS — R0789 Other chest pain: Secondary | ICD-10-CM | POA: Diagnosis not present

## 2020-12-22 DIAGNOSIS — F17210 Nicotine dependence, cigarettes, uncomplicated: Secondary | ICD-10-CM | POA: Diagnosis not present

## 2020-12-22 DIAGNOSIS — Z7982 Long term (current) use of aspirin: Secondary | ICD-10-CM | POA: Diagnosis not present

## 2020-12-22 DIAGNOSIS — I10 Essential (primary) hypertension: Secondary | ICD-10-CM | POA: Diagnosis not present

## 2020-12-22 DIAGNOSIS — J449 Chronic obstructive pulmonary disease, unspecified: Secondary | ICD-10-CM | POA: Diagnosis not present

## 2020-12-22 DIAGNOSIS — I249 Acute ischemic heart disease, unspecified: Secondary | ICD-10-CM | POA: Diagnosis not present

## 2020-12-22 DIAGNOSIS — R9439 Abnormal result of other cardiovascular function study: Secondary | ICD-10-CM | POA: Diagnosis not present

## 2020-12-22 DIAGNOSIS — I2510 Atherosclerotic heart disease of native coronary artery without angina pectoris: Secondary | ICD-10-CM | POA: Diagnosis not present

## 2020-12-22 DIAGNOSIS — R079 Chest pain, unspecified: Secondary | ICD-10-CM | POA: Diagnosis not present

## 2020-12-22 DIAGNOSIS — Z79899 Other long term (current) drug therapy: Secondary | ICD-10-CM | POA: Diagnosis not present

## 2020-12-22 DIAGNOSIS — K219 Gastro-esophageal reflux disease without esophagitis: Secondary | ICD-10-CM | POA: Diagnosis not present

## 2020-12-22 DIAGNOSIS — J9811 Atelectasis: Secondary | ICD-10-CM | POA: Diagnosis not present

## 2020-12-22 DIAGNOSIS — I361 Nonrheumatic tricuspid (valve) insufficiency: Secondary | ICD-10-CM | POA: Diagnosis not present

## 2020-12-22 DIAGNOSIS — I160 Hypertensive urgency: Secondary | ICD-10-CM | POA: Diagnosis not present

## 2020-12-22 DIAGNOSIS — Z20822 Contact with and (suspected) exposure to covid-19: Secondary | ICD-10-CM | POA: Diagnosis not present

## 2020-12-22 DIAGNOSIS — E785 Hyperlipidemia, unspecified: Secondary | ICD-10-CM | POA: Diagnosis not present

## 2020-12-22 DIAGNOSIS — E7800 Pure hypercholesterolemia, unspecified: Secondary | ICD-10-CM | POA: Diagnosis not present

## 2020-12-23 ENCOUNTER — Observation Stay (HOSPITAL_COMMUNITY)
Admission: AD | Admit: 2020-12-23 | Discharge: 2020-12-24 | Disposition: A | Discharge status: Home / Self Care | Payer: Medicare Other | Source: Other Acute Inpatient Hospital | Attending: Cardiovascular Disease | Admitting: Cardiovascular Disease

## 2020-12-23 ENCOUNTER — Encounter (HOSPITAL_COMMUNITY): Payer: Self-pay | Admitting: Cardiovascular Disease

## 2020-12-23 DIAGNOSIS — F17210 Nicotine dependence, cigarettes, uncomplicated: Secondary | ICD-10-CM | POA: Diagnosis not present

## 2020-12-23 DIAGNOSIS — R9439 Abnormal result of other cardiovascular function study: Secondary | ICD-10-CM | POA: Diagnosis present

## 2020-12-23 DIAGNOSIS — I2510 Atherosclerotic heart disease of native coronary artery without angina pectoris: Principal | ICD-10-CM | POA: Insufficient documentation

## 2020-12-23 DIAGNOSIS — E785 Hyperlipidemia, unspecified: Secondary | ICD-10-CM

## 2020-12-23 DIAGNOSIS — J449 Chronic obstructive pulmonary disease, unspecified: Secondary | ICD-10-CM | POA: Diagnosis not present

## 2020-12-23 DIAGNOSIS — Z720 Tobacco use: Secondary | ICD-10-CM

## 2020-12-23 DIAGNOSIS — I10 Essential (primary) hypertension: Secondary | ICD-10-CM | POA: Diagnosis not present

## 2020-12-23 HISTORY — DX: Abnormal result of other cardiovascular function study: R94.39

## 2020-12-23 MED ORDER — SODIUM CHLORIDE 0.9 % IV SOLN: 250.0000 mL | INTRAVENOUS | Status: DC | PRN

## 2020-12-23 MED ORDER — ALBUTEROL SULFATE HFA 108 (90 BASE) MCG/ACT IN AERS: 2.0000 | INHALATION_SPRAY | Freq: Two times a day (BID) | RESPIRATORY_TRACT | Status: DC

## 2020-12-23 MED ORDER — TRAZODONE HCL 100 MG PO TABS: 200.0000 mg | ORAL_TABLET | Freq: Every day | ORAL | Status: DC

## 2020-12-23 MED ORDER — IRBESARTAN 150 MG PO TABS
75.0000 mg | ORAL_TABLET | Freq: Every day | ORAL | Status: DC
  Administered 2020-12-24: 75 mg via ORAL
  Filled 2020-12-23: qty 1

## 2020-12-23 MED ORDER — MONTELUKAST SODIUM 10 MG PO TABS
10.0000 mg | ORAL_TABLET | Freq: Every morning | ORAL | Status: DC
  Administered 2020-12-23 – 2020-12-24 (×2): 10 mg via ORAL
  Filled 2020-12-23 (×2): qty 1

## 2020-12-23 MED ORDER — TIOTROPIUM BROMIDE MONOHYDRATE 1.25 MCG/ACT IN AERS: 2.0000 | INHALATION_SPRAY | Freq: Every day | RESPIRATORY_TRACT | Status: DC

## 2020-12-23 MED ORDER — DULOXETINE HCL 60 MG PO CPEP
60.0000 mg | ORAL_CAPSULE | Freq: Every morning | ORAL | Status: DC
  Administered 2020-12-24: 60 mg via ORAL
  Filled 2020-12-23: qty 1

## 2020-12-23 MED ORDER — SODIUM CHLORIDE 0.9 % WEIGHT BASED INFUSION: 1.0000 mL/kg/h | INTRAVENOUS | Status: DC

## 2020-12-23 MED ORDER — FENOFIBRATE 160 MG PO TABS
160.0000 mg | ORAL_TABLET | Freq: Every day | ORAL | Status: DC
  Administered 2020-12-23 – 2020-12-24 (×2): 160 mg via ORAL
  Filled 2020-12-23 (×2): qty 1

## 2020-12-23 MED ORDER — FLUTICASONE PROPIONATE 50 MCG/ACT NA SUSP
1.0000 | Freq: Every day | NASAL | Status: DC | PRN
  Filled 2020-12-23: qty 16

## 2020-12-23 MED ORDER — ACETAMINOPHEN 325 MG PO TABS: 650.0000 mg | ORAL_TABLET | ORAL | Status: DC | PRN

## 2020-12-23 MED ORDER — SODIUM CHLORIDE 0.9% FLUSH: 3.0000 mL | INTRAVENOUS | Status: DC | PRN

## 2020-12-23 MED ORDER — EZETIMIBE 10 MG PO TABS
10.0000 mg | ORAL_TABLET | Freq: Every day | ORAL | Status: DC
  Administered 2020-12-23: 10 mg via ORAL
  Filled 2020-12-23: qty 1

## 2020-12-23 MED ORDER — ALBUTEROL SULFATE (2.5 MG/3ML) 0.083% IN NEBU
2.5000 mg | INHALATION_SOLUTION | Freq: Two times a day (BID) | RESPIRATORY_TRACT | Status: DC
  Administered 2020-12-23 – 2020-12-24 (×3): 2.5 mg via RESPIRATORY_TRACT
  Filled 2020-12-23 (×3): qty 3

## 2020-12-23 MED ORDER — ASPIRIN EC 81 MG PO TBEC
81.0000 mg | DELAYED_RELEASE_TABLET | Freq: Every day | ORAL | Status: DC
  Administered 2020-12-23: 81 mg via ORAL
  Filled 2020-12-23: qty 1

## 2020-12-23 MED ORDER — ASPIRIN 81 MG PO CHEW: 81.0000 mg | CHEWABLE_TABLET | ORAL | Status: DC

## 2020-12-23 MED ORDER — TIZANIDINE HCL 4 MG PO TABS
2.0000 mg | ORAL_TABLET | Freq: Every day | ORAL | Status: DC
  Administered 2020-12-23: 2 mg via ORAL
  Filled 2020-12-23: qty 1

## 2020-12-23 MED ORDER — ICOSAPENT ETHYL 1 G PO CAPS
2.0000 g | ORAL_CAPSULE | Freq: Two times a day (BID) | ORAL | Status: DC
  Administered 2020-12-23 – 2020-12-24 (×2): 2 g via ORAL
  Filled 2020-12-23 (×4): qty 2

## 2020-12-23 MED ORDER — ONDANSETRON HCL 4 MG/2ML IJ SOLN: 4.0000 mg | Freq: Four times a day (QID) | INTRAMUSCULAR | Status: DC | PRN

## 2020-12-23 MED ORDER — UMECLIDINIUM BROMIDE 62.5 MCG/ACT IN AEPB
1.0000 | INHALATION_SPRAY | Freq: Every day | RESPIRATORY_TRACT | Status: DC
  Filled 2020-12-23: qty 7

## 2020-12-23 MED ORDER — ROSUVASTATIN CALCIUM 20 MG PO TABS
40.0000 mg | ORAL_TABLET | Freq: Every day | ORAL | Status: DC
  Administered 2020-12-23: 40 mg via ORAL
  Filled 2020-12-23: qty 2

## 2020-12-23 MED ORDER — TRAZODONE HCL 100 MG PO TABS
100.0000 mg | ORAL_TABLET | Freq: Every day | ORAL | Status: DC
  Administered 2020-12-24: 100 mg via ORAL
  Filled 2020-12-23 (×2): qty 1

## 2020-12-23 MED ORDER — ENOXAPARIN SODIUM 40 MG/0.4ML IJ SOSY
40.0000 mg | PREFILLED_SYRINGE | INTRAMUSCULAR | Status: DC
  Administered 2020-12-24: 40 mg via SUBCUTANEOUS
  Filled 2020-12-23: qty 0.4

## 2020-12-23 MED ORDER — SODIUM CHLORIDE 0.9% FLUSH
3.0000 mL | Freq: Two times a day (BID) | INTRAVENOUS | Status: DC
  Administered 2020-12-24 (×2): 3 mL via INTRAVENOUS

## 2020-12-23 MED ORDER — SODIUM CHLORIDE 0.9 % WEIGHT BASED INFUSION
3.0000 mL/kg/h | INTRAVENOUS | Status: AC
  Administered 2020-12-24: 3 mL/kg/h via INTRAVENOUS

## 2020-12-23 MED ORDER — NITROGLYCERIN 0.4 MG SL SUBL: 0.4000 mg | SUBLINGUAL_TABLET | SUBLINGUAL | Status: DC | PRN

## 2020-12-23 MED ORDER — PANTOPRAZOLE SODIUM 40 MG PO TBEC
40.0000 mg | DELAYED_RELEASE_TABLET | Freq: Every day | ORAL | Status: DC
  Administered 2020-12-23 – 2020-12-24 (×2): 40 mg via ORAL
  Filled 2020-12-23 (×2): qty 1

## 2020-12-23 MED ORDER — ASPIRIN EC 81 MG PO TBEC
81.0000 mg | DELAYED_RELEASE_TABLET | Freq: Every day | ORAL | Status: DC
  Filled 2020-12-23: qty 1

## 2020-12-23 NOTE — H&P (Addendum)
Cardiology Admission History and Physical:   Patient ID: TORRIN CRIHFIELD MRN: [id number]; DOB: 12-28-1965   Admission date: 12/23/2020  PCP:  Mateo Flow, MD   [facility] Providers Cardiologist:  None       Chief Complaint: Chest Pain/Abnormal Stress Test  Patient Profile:   VAUGHAN GARFINKLE is a 55 y.o. male with past medical history of HTN, HLD, bipolar disorder, COPD and tobacco use who presented to [facility] on 12/22/2020 for evaluation of chest pain and was transferred to [facility] 12/23/2020.  History of Present Illness:   Mr. Newsome presented to the ED on 12/22/2020 for chest pain, with associated dyspnea and fatigue. He reports initially having worsening dyspnea on exertion over the past 3-4 weeks which had progressed in severity. Says he has been without his inhalers during this time as well. Starting the day prior to admission, he developed left-sided chest pressure which could occur at rest or with activity. Denies any radiating pain. No recent orthopnea, PND or pitting edema.   Initial labs showed WBC 9.9, Hgb 15.6, platelets 178, Na+ 139, K+ 3.5 and creatinine 1.10. FLP showed total cholesterol 182, triglycerides 230, HDL 38 and LDL 98. proBNP was 328 and initial and follow-up Troponin I values were negative at less than 0.01. Negative for Influenza and COVID. EKG showed sinus bradycardia, heart rate 51 with isolated TWI along aVL. CT Head showed no acute intracranial abnormalities. CXR showed atelectasis along the right lung base but no acute abnormalities. An echocardiogram was obtained which showed a preserved EF of 55 to 60% with mild LVH and impaired relaxation. He did have trace MR and mild TR. RV function was normal.  He was evaluated by Dr. Agustin Cree in consult and a stress test was recommended for further evaluation. He underwent a [employer] which showed a moderate reversible perfusion defect in the lateral wall of the left ventricle which was  concerning for ischemia and was overall an intermediate risk study. A cardiac catheterization was therefore recommended for further evaluation. Upon arrival to [facility], he denies any recurrent chest pain.   Past Medical History:  Diagnosis Date   Arthritis    Bipolar disorder ([place])    COPD (chronic obstructive pulmonary disease) (HCC)    Depression    GERD (gastroesophageal reflux disease)    Hypertension     Past Surgical History:  Procedure Laterality Date   ANTERIOR CERVICAL DECOMP/DISCECTOMY FUSION N/A 05/12/2020   Procedure: Anterior Cervical Decompression Fusion - Cervical five-Cervical six, removal of Cervical six-seven plate;  Surgeon: Vallarie Mare, MD;  Location: [place];  Service: Neurosurgery;  Laterality: N/A;   ANTERIOR FUSION CERVICAL SPINE     BACK SURGERY     COLONOSCOPY W/ POLYPECTOMY     SHOULDER ARTHROSCOPY Left    cleaned up after accident     Medications Prior to Admission: Prior to Admission medications   Medication Sig Start Date End Date Taking? Authorizing Provider  acetaminophen (TYLENOL) 650 MG CR tablet Take 1,300 mg by mouth every 8 (eight) hours as needed for pain.    [provider]  albuterol (VENTOLIN HFA) 108 (90 Base) MCG/ACT inhaler Inhale 2 puffs into the lungs 2 (two) times daily. 12/15/19   [provider]  DULoxetine (CYMBALTA) 60 MG capsule Take 60 mg by mouth every morning. 01/19/20   [provider]  ezetimibe (ZETIA) 10 MG tablet Take 10 mg by mouth at bedtime. 01/19/20   [provider]  fenofibrate Iver Nestle)  145 MG tablet Take 145 mg by mouth at bedtime. 01/19/20   [provider]  fluticasone (FLONASE) 50 MCG/ACT nasal spray Place 1-2 sprays into both nostrils daily as needed for allergies or rhinitis.    [provider]  montelukast (SINGULAIR) 10 MG tablet Take 10 mg by mouth every morning. 01/19/20   [provider]  omeprazole (PRILOSEC) 40 MG capsule Take 40 mg by  mouth 2 (two) times daily. 01/19/20   [provider]  oxyCODONE-acetaminophen (PERCOCET/ROXICET) 5-325 MG tablet Take 1 tablet by mouth every 6 (six) hours as needed for severe pain. Patient not taking: No sig reported 02/15/20   Eustaquio Maize, PA-C  SPIRIVA RESPIMAT 1.25 MCG/ACT AERS Inhale 2 puffs into the lungs daily. 05/06/20   [provider]  tiZANidine (ZANAFLEX) 2 MG tablet Take 2 mg by mouth at bedtime. 01/01/20   [provider]  traZODone (DESYREL) 100 MG tablet Take 200 mg by mouth at bedtime. 12/12/19   [provider]  valsartan (DIOVAN) 80 MG tablet Take 80 mg by mouth every morning. 01/19/20   [provider]  VASCEPA 1 g capsule Take 2 g by mouth 2 (two) times daily. 01/19/20   [provider]     Allergies:    Allergies  Allergen Reactions   Pregabalin     Bumps in mouth   Aspirin     Nose bleed   Bupropion     Bumps in mouth   Prednisone     Mood changes    Social History:   Social History   Socioeconomic History   Marital status: Married    Spouse name: Not on file   Number of children: Not on file   Years of education: Not on file   Highest education level: Not on file  Occupational History   Not on file  Tobacco Use   Smoking status: Every Day    Packs/day: 1.00    Years: 35.00    Pack years: 35.00    Types: Cigarettes   Smokeless tobacco: Never  Vaping Use   Vaping Use: Never used  Substance and Sexual Activity   Alcohol use: Not Currently   Drug use: Not Currently   Sexual activity: Not on file  Other Topics Concern   Not on file  Social History Narrative   Not on file   Social Determinants of Health   Financial Resource Strain: Not on file  Food Insecurity: Food Insecurity Present   Worried About [place] in the Last Year: Sometimes true   Ran Out of Food in the Last Year: Sometimes true  Transportation Needs: No Transportation Needs   Lack of Transportation (Medical):  No   Lack of Transportation (Non-Medical): No  Physical Activity: Not on file  Stress: Not on file  Social Connections: Not on file  Intimate Partner Violence: Not on file    Family History:   The patient's family history includes Heart attack in his maternal aunt.    ROS:  Please see the history of present illness.  All other ROS reviewed and negative.     Physical Exam/Data:   Vitals:   12/23/20 1000  BP: (!) 127/93  Pulse: (!) 54  Resp: 17  Temp: 97.8 F (36.6 C)  TempSrc: Oral  SpO2: 94%  Weight: 94.4 kg  Height: 5\' 9"  (1.753 m)   No intake or output data in the 24 hours ending 12/23/20 1200 Last 3 Weights 12/23/2020 05/12/2020 05/11/2020  Weight (lbs) 208 lb 1.8 oz 210 lb 210 lb  Weight (kg) 94.4 kg 95.255 kg 95.255 kg     Body mass index is 30.73 kg/m.  General:  Well nourished, well developed male appearing in no acute distress. HEENT: normal Neck: no JVD Vascular: No carotid bruits; Distal pulses 2+ bilaterally   Cardiac:  normal S1, S2; RRR; no murmur. Lungs:  clear to auscultation bilaterally, no wheezing, rhonchi or rales  Abd: soft, nontender, no hepatomegaly  Ext: no pitting edema Musculoskeletal:  No deformities, BUE and BLE strength normal and equal Skin: warm and dry  Neuro:  CNs 2-12 intact, no focal abnormalities noted Psych:  Normal affect   EKG:  The ECG that was done  was personally reviewed and demonstrates sinus bradycardia, heart rate 51 with isolated TWI along aVL  Relevant CV Studies:  Echocardiogram: 11/2020 (at [facility]) Preserved EF of 55 to 60% with mild LVH and impaired relaxation. He did have trace MR and mild TR. RV function was normal.   NST: 11/2020 (at [facility] showed a moderate reversible perfusion defect in the lateral wall of the left ventricle which was concerning for ischemia and was overall an intermediate risk study.   Laboratory Data:  High Sensitivity Troponin:  No results for input(s):  TROPONINIHS in the last 720 hours.    ChemistryNo results for input(s): NA, K, CL, CO2, GLUCOSE, BUN, CREATININE, CALCIUM, MG, GFRNONAA, GFRAA, ANIONGAP in the last 168 hours.  No results for input(s): PROT, ALBUMIN, AST, ALT, ALKPHOS, BILITOT in the last 168 hours. Lipids No results for input(s): CHOL, TRIG, HDL, LABVLDL, LDLCALC, CHOLHDL in the last 168 hours. HematologyNo results for input(s): WBC, RBC, HGB, HCT, MCV, MCH, MCHC, RDW, PLT in the last 168 hours. Thyroid No results for input(s): TSH, FREET4 in the last 168 hours. BNPNo results for input(s): BNP, PROBNP in the last 168 hours.  DDimer No results for input(s): DDIMER in the last 168 hours.   Radiology/Studies:  No results found.   Assessment and Plan:   1. Chest Pain/Abnormal Stress Test - Presented with worsening dyspnea on exertion for the past several weeks and had started to experience left-sided chest pressure the day prior to admission.  - Troponin I values were negative at [facility]. EKG shows sinus bradycardia, heart rate 51 with isolated TWI along aVL. NST showed a moderate reversible perfusion defect in the lateral wall of the left ventricle which was concerning for ischemia and was overall an intermediate risk study. - He is scheduled for a cardiac catheterization tomorrow.The patient understands that risks include but are not limited to stroke (1 in 1000), death (1 in 16), kidney failure [usually temporary] (1 in 500), bleeding (1 in 200), allergic reaction [possibly serious] (1 in 200). Keep NPO after midnight.  - Continue ASA, Zetia and Vascepa. Will order Crestor 40mg  daily. No BB given HR in the 50's. No Heparin for now but would start if he has recurrent chest pain.   2. HTN - BP is at 127/93 on most recent check. Continue PTA Irbesartan 75mg  daily.   3. HLD - FLP showed total cholesterol 182, triglycerides 230, HDL 38 and LDL 98. He is on Zetia, Fenofibrate and Vascepa. He is unaware of any prior statin  intolerances, therefore will start Crestor 40mg  daily. Will need repeat FLP and LFT's in 6-8 weeks.   4. COPD/Tobacco Use - He does smoke 0.5 ppd. Cessation advised. Will order PTA inhalers.    Severity of Illness: The  appropriate patient status for this patient is INPATIENT. Inpatient status is judged to be reasonable and necessary in order to provide the required intensity of service to ensure the patient's safety. The patient's presenting symptoms, physical exam findings, and initial radiographic and laboratory data in the context of their chronic comorbidities is felt to place them at high risk for further clinical deterioration. Furthermore, it is not anticipated that the patient will be medically stable for discharge from the hospital within 2 midnights of admission.   * I certify that at the point of admission it is my clinical judgment that the patient will require inpatient hospital care spanning beyond 2 midnights from the point of admission due to high intensity of service, high risk for further deterioration and high frequency of surveillance required.*   For questions or updates, please contact [place] Please consult [email or web address] for contact info under     Signed, Erma Heritage, PA-C  12/23/2020 12:00 PM   Patient seen and examined with Bernerd Pho, PA-C.  Agree as above, with the following exceptions and changes as noted below.  55 year old male presenting after abnormal nuclear stress test ordered for chest pain, transferred from [facility].  He is currently nearly chest pain-free, and pain is nitro responsive.  Seen initially for chest pain, with dyspnea and fatigue.  He is an active smoker.  Resting angina left-sided, no radiation.  Negative troponin, ECG shows sinus bradycardia, heart rate 51, T wave inversion aVL not significantly changed from ECG February 15, 2020. Gen: NAD, CV: RRR, no murmurs, Lungs: clear, Abd: soft, Extrem: Warm, well perfused, no  edema, Neuro/Psych: alert and oriented x 3, normal mood and affect. All available labs, radiology testing, previous records reviewed.  Report of nuclear stress test with lateral perfusion defect, reported as intermediate risk study.  Transferred with anticipated coronary angiography tomorrow.  Patient has been consented as noted above.  Continue medical therapy as noted above, baseline bradycardia therefore no beta-blocker.  With unremarkable troponins and no significant ECG changes, IV heparin not indicated at this time, however if chest pain escalates would reassess.  Elouise Munroe, MD 12/23/20 12:15 PM

## 2020-12-24 ENCOUNTER — Other Ambulatory Visit: Payer: Self-pay

## 2020-12-24 ENCOUNTER — Encounter (HOSPITAL_COMMUNITY)
Admission: AD | Disposition: A | Discharge status: Home / Self Care | Payer: Self-pay | Source: Other Acute Inpatient Hospital | Attending: Cardiovascular Disease

## 2020-12-24 ENCOUNTER — Other Ambulatory Visit (HOSPITAL_COMMUNITY): Payer: Self-pay

## 2020-12-24 ENCOUNTER — Ambulatory Visit (HOSPITAL_COMMUNITY): Admit: 2020-12-24 | Payer: Medicare Other | Admitting: Cardiovascular Disease

## 2020-12-24 ENCOUNTER — Encounter (HOSPITAL_COMMUNITY): Payer: Self-pay | Admitting: Cardiovascular Disease

## 2020-12-24 DIAGNOSIS — I25118 Atherosclerotic heart disease of native coronary artery with other forms of angina pectoris: Secondary | ICD-10-CM | POA: Diagnosis not present

## 2020-12-24 DIAGNOSIS — I2510 Atherosclerotic heart disease of native coronary artery without angina pectoris: Secondary | ICD-10-CM

## 2020-12-24 DIAGNOSIS — J449 Chronic obstructive pulmonary disease, unspecified: Secondary | ICD-10-CM | POA: Diagnosis not present

## 2020-12-24 DIAGNOSIS — E785 Hyperlipidemia, unspecified: Secondary | ICD-10-CM

## 2020-12-24 DIAGNOSIS — I10 Essential (primary) hypertension: Secondary | ICD-10-CM | POA: Diagnosis not present

## 2020-12-24 DIAGNOSIS — R9439 Abnormal result of other cardiovascular function study: Secondary | ICD-10-CM

## 2020-12-24 DIAGNOSIS — Z720 Tobacco use: Secondary | ICD-10-CM

## 2020-12-24 DIAGNOSIS — F17210 Nicotine dependence, cigarettes, uncomplicated: Secondary | ICD-10-CM | POA: Diagnosis not present

## 2020-12-24 HISTORY — DX: Hyperlipidemia, unspecified: E78.5

## 2020-12-24 HISTORY — DX: Tobacco use: Z72.0

## 2020-12-24 HISTORY — PX: LEFT HEART CATH AND CORONARY ANGIOGRAPHY: CATH118249

## 2020-12-24 LAB — CBC
HCT: 42.2 % (ref 39.0–52.0)
Hemoglobin: 15.2 g/dL (ref 13.0–17.0)
MCH: 33.1 pg (ref 26.0–34.0)
MCHC: 36 g/dL (ref 30.0–36.0)
MCV: 91.9 fL (ref 80.0–100.0)
Platelets: 168 10*3/uL (ref 150–400)
RBC: 4.59 MIL/uL (ref 4.22–5.81)
RDW: 13 % (ref 11.5–15.5)
WBC: 7.4 10*3/uL (ref 4.0–10.5)
nRBC: 0 % (ref 0.0–0.2)

## 2020-12-24 LAB — BASIC METABOLIC PANEL
Anion gap: 8 (ref 5–15)
BUN: 12 mg/dL (ref 6–20)
CO2: 23 mmol/L (ref 22–32)
Calcium: 9.1 mg/dL (ref 8.9–10.3)
Chloride: 105 mmol/L (ref 98–111)
Creatinine, Ser: 1.39 mg/dL — ABNORMAL HIGH (ref 0.61–1.24)
GFR, Estimated: 60 mL/min — ABNORMAL LOW (ref >60)
Glucose, Bld: 106 mg/dL — ABNORMAL HIGH (ref 70–99)
Potassium: 3.2 mmol/L — ABNORMAL LOW (ref 3.5–5.1)
Sodium: 136 mmol/L (ref 135–145)

## 2020-12-24 LAB — HIV ANTIBODY (ROUTINE TESTING W REFLEX): HIV Screen 4th Generation wRfx: NONREACTIVE

## 2020-12-24 LAB — LIPID PANEL
Cholesterol: 140 mg/dL (ref 0–200)
HDL: 35 mg/dL — ABNORMAL LOW (ref >40)
LDL Cholesterol: 54 mg/dL (ref 0–99)
Total CHOL/HDL Ratio: 4 RATIO
Triglycerides: 256 mg/dL — ABNORMAL HIGH (ref <150)
VLDL: 51 mg/dL — ABNORMAL HIGH (ref 0–40)

## 2020-12-24 SURGERY — LEFT HEART CATH AND CORONARY ANGIOGRAPHY
Anesthesia: LOCAL

## 2020-12-24 MED ORDER — MIDAZOLAM HCL 2 MG/2ML IJ SOLN
INTRAMUSCULAR | Status: DC | PRN
  Administered 2020-12-24: 2 mg via INTRAVENOUS

## 2020-12-24 MED ORDER — SODIUM CHLORIDE 0.9 % IV SOLN: 250.0000 mL | INTRAVENOUS | Status: DC | PRN

## 2020-12-24 MED ORDER — VERAPAMIL HCL 2.5 MG/ML IV SOLN
INTRAVENOUS | Status: AC
  Filled 2020-12-24: qty 2

## 2020-12-24 MED ORDER — LABETALOL HCL 5 MG/ML IV SOLN: 10.0000 mg | INTRAVENOUS | Status: DC | PRN

## 2020-12-24 MED ORDER — VERAPAMIL HCL 2.5 MG/ML IV SOLN
INTRAVENOUS | Status: DC | PRN
  Administered 2020-12-24: 10 mL via INTRA_ARTERIAL

## 2020-12-24 MED ORDER — ONDANSETRON HCL 4 MG/2ML IJ SOLN: 4.0000 mg | Freq: Four times a day (QID) | INTRAMUSCULAR | Status: DC | PRN

## 2020-12-24 MED ORDER — HEPARIN SODIUM (PORCINE) 1000 UNIT/ML IJ SOLN
INTRAMUSCULAR | Status: DC | PRN
  Administered 2020-12-24: 4700 [IU] via INTRAVENOUS

## 2020-12-24 MED ORDER — HEPARIN (PORCINE) IN NACL 1000-0.9 UT/500ML-% IV SOLN
INTRAVENOUS | Status: AC
  Filled 2020-12-24: qty 500

## 2020-12-24 MED ORDER — SODIUM CHLORIDE 0.9% FLUSH
3.0000 mL | Freq: Two times a day (BID) | INTRAVENOUS | Status: DC
  Administered 2020-12-24: 3 mL via INTRAVENOUS

## 2020-12-24 MED ORDER — POLYETHYLENE GLYCOL 3350 17 G PO PACK: 17.0000 g | PACK | Freq: Every day | ORAL | Status: DC

## 2020-12-24 MED ORDER — NITROGLYCERIN 0.4 MG SL SUBL
0.4000 mg | SUBLINGUAL_TABLET | SUBLINGUAL | 1 refills | Status: AC | PRN
  Filled 2020-12-24: qty 25, 7d supply, fill #0

## 2020-12-24 MED ORDER — LIDOCAINE HCL (PF) 1 % IJ SOLN
INTRAMUSCULAR | Status: DC | PRN
  Administered 2020-12-24: 2 mL via INTRADERMAL

## 2020-12-24 MED ORDER — FENTANYL CITRATE (PF) 100 MCG/2ML IJ SOLN
INTRAMUSCULAR | Status: DC | PRN
  Administered 2020-12-24: 50 ug via INTRAVENOUS

## 2020-12-24 MED ORDER — HEPARIN (PORCINE) IN NACL 1000-0.9 UT/500ML-% IV SOLN
INTRAVENOUS | Status: DC | PRN
  Administered 2020-12-24 (×2): 500 mL

## 2020-12-24 MED ORDER — LIDOCAINE HCL (PF) 1 % IJ SOLN
INTRAMUSCULAR | Status: AC
  Filled 2020-12-24: qty 30

## 2020-12-24 MED ORDER — HEPARIN SODIUM (PORCINE) 1000 UNIT/ML IJ SOLN
INTRAMUSCULAR | Status: AC
  Filled 2020-12-24: qty 10

## 2020-12-24 MED ORDER — IOHEXOL 350 MG/ML SOLN
INTRAVENOUS | Status: DC | PRN
  Administered 2020-12-24: 75 mL via INTRA_ARTERIAL

## 2020-12-24 MED ORDER — HYDRALAZINE HCL 20 MG/ML IJ SOLN: 10.0000 mg | INTRAMUSCULAR | Status: DC | PRN

## 2020-12-24 MED ORDER — ROSUVASTATIN CALCIUM 40 MG PO TABS
40.0000 mg | ORAL_TABLET | Freq: Every day | ORAL | 2 refills | Status: AC
  Filled 2020-12-24: qty 90, 90d supply, fill #0

## 2020-12-24 MED ORDER — POTASSIUM CHLORIDE CRYS ER 20 MEQ PO TBCR: 80.0000 meq | EXTENDED_RELEASE_TABLET | Freq: Once | ORAL | Status: DC

## 2020-12-24 MED ORDER — SODIUM CHLORIDE 0.9% FLUSH: 3.0000 mL | INTRAVENOUS | Status: DC | PRN

## 2020-12-24 MED ORDER — ASPIRIN 81 MG PO CHEW: 81.0000 mg | CHEWABLE_TABLET | Freq: Every day | ORAL | Status: DC

## 2020-12-24 MED ORDER — FENTANYL CITRATE (PF) 100 MCG/2ML IJ SOLN
INTRAMUSCULAR | Status: AC
  Filled 2020-12-24: qty 2

## 2020-12-24 MED ORDER — MIDAZOLAM HCL 2 MG/2ML IJ SOLN
INTRAMUSCULAR | Status: AC
  Filled 2020-12-24: qty 2

## 2020-12-24 MED ORDER — SODIUM CHLORIDE 0.9 % IV SOLN: INTRAVENOUS | Status: DC

## 2020-12-24 MED ORDER — ISOSORBIDE MONONITRATE ER 30 MG PO TB24
30.0000 mg | ORAL_TABLET | Freq: Every day | ORAL | 2 refills | Status: DC
  Filled 2020-12-24: qty 90, 90d supply, fill #0

## 2020-12-24 MED ORDER — ACETAMINOPHEN 325 MG PO TABS: 650.0000 mg | ORAL_TABLET | ORAL | Status: DC | PRN

## 2020-12-24 MED ORDER — SENNOSIDES-DOCUSATE SODIUM 8.6-50 MG PO TABS: 1.0000 | ORAL_TABLET | Freq: Once | ORAL | Status: DC

## 2020-12-24 SURGICAL SUPPLY — 10 items
CATH INFINITI JR4 5F (CATHETERS) ×1 IMPLANT
CATH OPTITORQUE TIG 4.0 5F (CATHETERS) ×1 IMPLANT
DEVICE RAD COMP TR BAND LRG (VASCULAR PRODUCTS) ×1 IMPLANT
GLIDESHEATH SLEND SS 6F .021 (SHEATH) ×1 IMPLANT
GUIDEWIRE INQWIRE 1.5J.035X260 (WIRE) IMPLANT
INQWIRE 1.5J .035X260CM (WIRE) ×2
KIT HEART LEFT (KITS) ×2 IMPLANT
PACK CARDIAC CATHETERIZATION (CUSTOM PROCEDURE TRAY) ×2 IMPLANT
TRANSDUCER W/STOPCOCK (MISCELLANEOUS) ×2 IMPLANT
TUBING CIL FLEX 10 FLL-RA (TUBING) ×2 IMPLANT

## 2020-12-24 NOTE — Progress Notes (Signed)
Pt arrived back on 4E from cath lab. TR band in place with 13cc of air, no bleeding. VSS. Call light  in reach  Raelyn Number, RN

## 2020-12-24 NOTE — Progress Notes (Signed)
Pt discharged from 4E to home with wife. Discharge instructions and medication education provided. Telemetry and IV removed.   Raelyn Number, RN 12/24/20 3:05 PM

## 2020-12-24 NOTE — Discharge Summary (Addendum)
Discharge Summary    Patient ID: Jimmy Morrison MRN: [id number]; DOB: [birth year]  Admit date: 12/23/2020 Discharge date: 12/24/2020  PCP:  Mateo Flow, MD   [facility] Providers Cardiologist:  [facility]  Discharge Diagnoses    Principal Problem:   Abnormal stress test Active Problems:   Coronary artery disease   Hypertension   Hyperlipidemia   COPD (chronic obstructive pulmonary disease) ([place])   Tobacco use  Diagnostic Studies/Procedures    LHC 12/24/20     1st Diag lesion is 50% stenosed.   Prox RCA to Mid RCA lesion is 100% stenosed.   Prox Cx lesion is 20% stenosed.   Mid Cx lesion is 20% stenosed.   Moderate multivessel CAD with with 50% stenosis in the first diagonal branch of the LAD with otherwise normal-appearing LAD; nonobstructive 20% stenoses in the circumflex vessel; and chronic appearing total occlusion of the proximal RCA with antegrade bridging collateralization as well as retrograde left to right collateralization to the distal vessel.   Normal LV contractility with EF estimated 50 to 55% without definitive segmental wall motion abnormalities.  LVEDP 10 mmHg.   RECOMMENDATION: Medical therapy.  Patient was br[facility]cardic at [facility].  Consider adding isosorbide and/or amlodipine for anti-ischemic benefit.  Aggressive lipid-lowering therapy with target LDL less than 70.  Coronary Diagrams  Diagnostic Dominance: Right Intervention  _____________   History of Present Illness     Jimmy Morrison is a 55 y.o. male with pmh of HTN, HLD, bipolar disorder, COPD and tobacco use who presented to [facility] on 12/22/20 for chest pain and was transferred to [facility] 12/23/20.   Hospital Course     Consultants: None  In the ER Hgb 15.6, Scr 1.19. FLP showed total chol 182, TG 230, HDL 38, LDL 98, proBNP 328. Troponin negative less than 0.01. negative for Flu and covid. EKG showed SB, HR 51 with isolated TWI along with aVL. CT head  negative. Echo showed was obtained which showed a preserved EF 55-60%, mild LVH and impaired relaxation. HE did have trace MR and mild TR, RV function was normal.   He was evaluated by Dr. Agustin Cree in consult and a stress test was recommended for further evaluation. He underwent Lexiscan Myoview which showed a moderate reversible perfusion defect in the lateral wall of the left ventricle which was concerning for ischemia, overall intermediate risk study. A cardiac cath was recommended.   He was transferred to [facility] and underwent cath showing moderate CAD with 50% stenosis in first diagonal branch of the LAD with the otherwise normal-appearing LAD, nonobstructive 20% stenosis in the Cx vessel, chronic total occlusion of the proximal RCA with antegrade bridging collateralization as well as retrograde left to right collateralization to the distal vessel. Normal LV contractibility with EF estimated 50-55% without definitive segmental wall motion abnormalities, LVEDP 66mmHg. Recommendations for medical therapy. Patient was bradycardic at [facility]. Patient tolerated procedure well. Imdur 30mg  was added for antianginal benefits. The patient was kept in observation post-procedure, cath site remained stable.   The patient was evaluated by Dr. Angelena Form and felt to be stable from a cardiac standpoint.   General:  Well nourished, well developed male appearing in no acute distress. HEENT: normal Neck: no JVD Vascular: No carotid bruits; Distal pulses 2+ bilaterally   Cardiac:  normal S1, S2; RRR; no murmur. Lungs:  clear to auscultation bilaterally, no wheezing, rhonchi or rales  Abd: soft, nontender, no hepatomegaly  Ext: no pitting edema Musculoskeletal:  No deformities, BUE and BLE strength normal and equal Skin: warm and dry  Neuro:  CNs 2-12 intact, no focal abnormalities noted Psych:  Normal affect   Did the patient have an acute coronary syndrome (MI, NSTEMI, STEMI, etc) this admission?:  No                                Did the patient have a percutaneous coronary intervention (stent / angioplasty)?:  No.       _____________  Discharge Vitals Blood pressure 132/85, pulse (!) 51, temperature 97.8 F (36.6 C), temperature source Oral, resp. rate 12, height 5\' 9"  (1.753 m), weight 97.6 kg, SpO2 94 %.  Filed Weights   12/23/20 1000 12/24/20 0500  Weight: 94.4 kg 97.6 kg    Labs & Radiologic Studies    CBC Recent Labs    12/24/20 0128  WBC 7.4  HGB 15.2  HCT 42.2  MCV 91.9  PLT 694   Basic Metabolic Panel Recent Labs    12/24/20 0128  NA 136  K 3.2*  CL 105  CO2 23  GLUCOSE 106*  BUN 12  CREATININE 1.39*  CALCIUM 9.1   Liver Function Tests No results for input(s): AST, ALT, ALKPHOS, BILITOT, PROT, ALBUMIN in the last 72 hours. No results for input(s): LIPASE, AMYLASE in the last 72 hours. High Sensitivity Troponin:   No results for input(s): TROPONINIHS in the last 720 hours.  BNP Invalid input(s): POCBNP D-Dimer No results for input(s): DDIMER in the last 72 hours. Hemoglobin A1C No results for input(s): HGBA1C in the last 72 hours. Fasting Lipid Panel Recent Labs    12/24/20 0128  CHOL 140  HDL 35*  LDLCALC 54  TRIG 256*  CHOLHDL 4.0   Thyroid Function Tests No results for input(s): TSH, T4TOTAL, T3FREE, THYROIDAB in the last 72 hours.  Invalid input(s): FREET3 _____________  CARDIAC CATHETERIZATION  Result Date: 12/24/2020   1st Diag lesion is 50% stenosed.   Prox RCA to Mid RCA lesion is 100% stenosed.   Prox Cx lesion is 20% stenosed.   Mid Cx lesion is 20% stenosed. Moderate multivessel CAD with with 50% stenosis in the first diagonal branch of the LAD with otherwise normal-appearing LAD; nonobstructive 20% stenoses in the circumflex vessel; and chronic appearing total occlusion of the proximal RCA with antegrade bridging collateralization as well as retrograde left to right collateralization to the distal vessel. Normal LV contractility  with EF estimated 50 to 55% without definitive segmental wall motion abnormalities.  LVEDP 10 mmHg. RECOMMENDATION: Medical therapy.  Patient was bradycardic at [facility].  Consider adding isosorbide and/or amlodipine for anti-ischemic benefit.  Aggressive lipid-lowering therapy with target LDL less than 70.   Disposition   Pt is being discharged home today in good condition.  Follow-up Plans & Appointments     Follow-up [place] Follow up.   Specialty: Cardiology Why: Office should call to make hospital follow-up in 1-2 weeks. Please call and shedule visit if you do not receive a call. Contact information: [place] [phone]                 Discharge Medications   Allergies as of 12/24/2020       Reactions   Pregabalin    Bumps in mouth   Aspirin    Nose bleed  Bupropion    Bumps in mouth   Prednisone    Mood changes        Medication List     STOP taking these medications    celecoxib 200 MG capsule Commonly known as: CELEBREX       TAKE these medications    acetaminophen 650 MG CR tablet Commonly known as: TYLENOL Take 1,300 mg by mouth every 8 (eight) hours as needed for pain.   albuterol 108 (90 Base) MCG/ACT inhaler Commonly known as: VENTOLIN HFA Inhale 2 puffs into the lungs 2 (two) times daily.   aspirin EC 81 MG tablet Take 81 mg by mouth at bedtime. Swallow whole.   DULoxetine 60 MG capsule Commonly known as: CYMBALTA Take 60 mg by mouth every morning.   ezetimibe 10 MG tablet Commonly known as: ZETIA Take 10 mg by mouth at bedtime.   fenofibrate 145 MG tablet Commonly known as: TRICOR Take 145 mg by mouth at bedtime.   fluticasone 50 MCG/ACT nasal spray Commonly known as: FLONASE Place 1-2 sprays into both nostrils daily as needed for allergies or rhinitis.   isosorbide mononitrate 30 MG 24 hr tablet Commonly  known as: IMDUR Take 1 tablet (30 mg total) by mouth daily.   magnesium oxide 400 MG tablet Commonly known as: MAG-OX Take 400 mg by mouth daily.   montelukast 10 MG tablet Commonly known as: SINGULAIR Take 10 mg by mouth every morning.   nitroGLYCERIN 0.4 MG SL tablet Commonly known as: NITROSTAT Place 1 tablet (0.4 mg total) under the tongue every 5 (five) minutes x 3 doses as needed for chest pain.   omeprazole 40 MG capsule Commonly known as: PRILOSEC Take 40 mg by mouth 2 (two) times daily.   rosuvastatin 40 MG tablet Commonly known as: CRESTOR Take 1 tablet (40 mg total) by mouth daily at 6 PM.   Spiriva Respimat 1.25 MCG/ACT Aers Generic drug: Tiotropium Bromide Monohydrate Inhale 2 puffs into the lungs daily.   tiZANidine 2 MG tablet Commonly known as: ZANAFLEX Take 2 mg by mouth at bedtime.   traZODone 100 MG tablet Commonly known as: DESYREL Take 100-200 mg by mouth at bedtime.   valsartan 80 MG tablet Commonly known as: DIOVAN Take 80 mg by mouth every morning.   Vascepa 1 g capsule Generic drug: icosapent Ethyl Take 2 g by mouth 2 (two) times daily.   vitamin B-12 1000 MCG tablet Commonly known as: CYANOCOBALAMIN Take 1,000 mcg by mouth daily.           Outstanding Labs/Studies   None  Duration of Discharge Encounter   Greater than 30 minutes including physician time.  Signed, Cadence Ninfa Meeker, PA-C 12/24/2020, 12:50 PM  I have personally seen and examined this patient. I agree with the assessment and plan as outlined above.  See my note. Cath with CTO RCA. Mild dz otherwise.  Add Imdur Discharge home  Lauree Chandler 12/24/2020 1:10 PM

## 2020-12-24 NOTE — Progress Notes (Signed)
Cardiology Note:   Pt admitted yesterday on transfer from [facility]. Abnormal stress test ordered for chest pain. Cardiac cath here today with CTO of the RCA and mild disease in the Circumflex and Diagonal OK to d/c home today after bedrest Will add Imdur 30 mg daily.   Lauree Chandler 12/24/2020 11:55 AM

## 2020-12-24 NOTE — Care Management Obs Status (Signed)
[place] NOTIFICATION   Patient Details  Name: Jimmy Morrison MRN: [id number] Date of Birth: October 21, 1965   Medicare Observation Status Notification Given:  Yes    Dawayne Patricia, RN 12/24/2020, 1:58 PM

## 2020-12-24 NOTE — Care Management CC44 (Signed)
Condition Code 44 Documentation Completed  Patient Details  Name: Jimmy Morrison MRN: [id number] Date of Birth: Mar 05, 1965   Condition Code 44 given:  Yes Patient signature on Condition Code 44 notice:  Yes Documentation of 2 MD's agreement:  Yes Code 44 added to claim:  Yes    Dawayne Patricia, RN 12/24/2020, 1:58 PM

## 2020-12-24 NOTE — Interval H&P Note (Signed)
Cath Lab Visit (complete for each Cath Lab visit)  Clinical Evaluation Leading to the Procedure:   ACS: No.  Non-ACS:    Anginal Classification: CCS III  Anti-ischemic medical therapy: No Therapy  Non-Invasive Test Results: Intermediate-risk stress test findings: cardiac mortality 1-3%/year  Prior CABG: No previous CABG      History and Physical Interval Note:  12/24/2020 7:44 AM  Jimmy Morrison  has presented today for surgery, with the diagnosis of chest pain.  The various methods of treatment have been discussed with the patient and family. After consideration of risks, benefits and other options for treatment, the patient has consented to  Procedure(s): LEFT HEART CATH AND CORONARY ANGIOGRAPHY (N/A) as a surgical intervention.  The patient's history has been reviewed, patient examined, no change in status, stable for surgery.  I have reviewed the patient's chart and labs.  Questions were answered to the patient's satisfaction.     Shelva Majestic

## 2020-12-24 NOTE — Plan of Care (Signed)
POC initiated and progressing. 

## 2020-12-28 DIAGNOSIS — F17210 Nicotine dependence, cigarettes, uncomplicated: Secondary | ICD-10-CM | POA: Diagnosis not present

## 2020-12-28 DIAGNOSIS — F17200 Nicotine dependence, unspecified, uncomplicated: Secondary | ICD-10-CM | POA: Diagnosis not present

## 2020-12-28 DIAGNOSIS — J441 Chronic obstructive pulmonary disease with (acute) exacerbation: Secondary | ICD-10-CM | POA: Diagnosis not present

## 2020-12-28 DIAGNOSIS — I25119 Atherosclerotic heart disease of native coronary artery with unspecified angina pectoris: Secondary | ICD-10-CM | POA: Diagnosis not present

## 2020-12-28 DIAGNOSIS — J449 Chronic obstructive pulmonary disease, unspecified: Secondary | ICD-10-CM | POA: Diagnosis not present

## 2020-12-28 DIAGNOSIS — E782 Mixed hyperlipidemia: Secondary | ICD-10-CM | POA: Diagnosis not present

## 2021-01-04 ENCOUNTER — Encounter: Payer: Self-pay | Admitting: *Deleted

## 2021-01-05 DIAGNOSIS — L858 Other specified epidermal thickening: Secondary | ICD-10-CM | POA: Diagnosis not present

## 2021-01-05 DIAGNOSIS — I25119 Atherosclerotic heart disease of native coronary artery with unspecified angina pectoris: Secondary | ICD-10-CM | POA: Diagnosis not present

## 2021-01-05 DIAGNOSIS — M961 Postlaminectomy syndrome, not elsewhere classified: Secondary | ICD-10-CM | POA: Diagnosis not present

## 2021-01-10 DIAGNOSIS — G5603 Carpal tunnel syndrome, bilateral upper limbs: Secondary | ICD-10-CM | POA: Diagnosis not present

## 2021-01-10 DIAGNOSIS — M545 Low back pain, unspecified: Secondary | ICD-10-CM | POA: Diagnosis not present

## 2021-01-10 DIAGNOSIS — M5412 Radiculopathy, cervical region: Secondary | ICD-10-CM | POA: Diagnosis not present

## 2021-01-17 DIAGNOSIS — M2560 Stiffness of unspecified joint, not elsewhere classified: Secondary | ICD-10-CM | POA: Diagnosis not present

## 2021-01-17 DIAGNOSIS — R531 Weakness: Secondary | ICD-10-CM | POA: Diagnosis not present

## 2021-01-17 DIAGNOSIS — M545 Low back pain, unspecified: Secondary | ICD-10-CM | POA: Diagnosis not present

## 2021-01-18 ENCOUNTER — Telehealth: Payer: Self-pay

## 2021-01-18 NOTE — Telephone Encounter (Signed)
Pt has a new patient/hospital follow-up appointment scheduled with Dr. Bettina Gavia on 03/01/2021. Will review pre-op clearance for carpal tunnel release at that time.   Jimmy Sciara, NP

## 2021-01-18 NOTE — Telephone Encounter (Addendum)
° °  Pre-operative Risk Assessment    Patient Name: Jimmy Morrison  DOB: [birth year] MRN: [id number]{      Request for Surgical Clearance{   Procedure:   Carpal Tunnel Release  Date of Surgery:  Clearance TBD                               Surgeon:  Dr Duffy Rhody Surgeon's Group or Practice Name:  [facility] Phone number:  [phone] ext. [phone]  Contact: Nikki Fax number:  [phone]  Type of Clearance Requested:   - Medical   Type of Anesthesia:  Local   Additional requests/questions:   N/A  Signed, Arshawn Valdez T   01/18/2021, 2:01 PM

## 2021-01-27 DIAGNOSIS — M961 Postlaminectomy syndrome, not elsewhere classified: Secondary | ICD-10-CM | POA: Diagnosis not present

## 2021-02-21 DIAGNOSIS — F419 Anxiety disorder, unspecified: Secondary | ICD-10-CM | POA: Insufficient documentation

## 2021-02-21 DIAGNOSIS — K219 Gastro-esophageal reflux disease without esophagitis: Secondary | ICD-10-CM | POA: Insufficient documentation

## 2021-02-21 DIAGNOSIS — F32A Depression, unspecified: Secondary | ICD-10-CM | POA: Insufficient documentation

## 2021-02-21 DIAGNOSIS — G5600 Carpal tunnel syndrome, unspecified upper limb: Secondary | ICD-10-CM | POA: Insufficient documentation

## 2021-02-21 DIAGNOSIS — F319 Bipolar disorder, unspecified: Secondary | ICD-10-CM | POA: Insufficient documentation

## 2021-02-21 DIAGNOSIS — G4733 Obstructive sleep apnea (adult) (pediatric): Secondary | ICD-10-CM | POA: Insufficient documentation

## 2021-02-21 DIAGNOSIS — M1990 Unspecified osteoarthritis, unspecified site: Secondary | ICD-10-CM | POA: Insufficient documentation

## 2021-02-24 DIAGNOSIS — Z79899 Other long term (current) drug therapy: Secondary | ICD-10-CM | POA: Diagnosis not present

## 2021-02-24 DIAGNOSIS — G894 Chronic pain syndrome: Secondary | ICD-10-CM | POA: Diagnosis not present

## 2021-02-24 DIAGNOSIS — M549 Dorsalgia, unspecified: Secondary | ICD-10-CM | POA: Diagnosis not present

## 2021-02-24 DIAGNOSIS — Z9889 Other specified postprocedural states: Secondary | ICD-10-CM | POA: Diagnosis not present

## 2021-02-24 DIAGNOSIS — Z1389 Encounter for screening for other disorder: Secondary | ICD-10-CM | POA: Diagnosis not present

## 2021-02-28 NOTE — Progress Notes (Signed)
Cardiology Office Note:    Date:  03/01/2021   ID:  HOLLISTER WESSLER, DOB May 15, 1965, MRN [id number]  PCP:  Mateo Flow, MD  Cardiologist:  Shirlee More, MD    Referring MD: Mateo Flow, MD    ASSESSMENT:    1. Coronary artery disease involving native coronary artery of native heart with other form of angina pectoris ([place])   2. Sinus bradycardia   3. Primary hypertension   4. Mixed hyperlipidemia   5. Chronic obstructive pulmonary disease, unspecified COPD type ([place])    PLAN:    In order of problems listed above:  Joven is doing well clinically having no angina on current medical therapy he is intolerant of oral nitrate he has bradycardia will avoid beta-blocker treatment plan on a low-dose calcium channel blocker amlodipine patient Continue his other medical therapy including aspirin lipid-lowering with rosuvastatin and icosapent ethyl Stable Controlled continue treatment including valsartan Continues high intensity statin Stable I suspect that the mechanism of shortness of breath Think he is optimized for his planned spine surgery  Next appointment: 6 months   Medication Adjustments/Labs and Tests Ordered: Current medicines are reviewed at length with the patient today.  Concerns regarding medicines are outlined above.  Orders Placed This Encounter  Procedures   EKG 12-Lead   Meds ordered this encounter  Medications   amLODipine (NORVASC) 2.5 MG tablet    Sig: Take 1 tablet (2.5 mg total) by mouth daily.    Dispense:  180 tablet    Refill:  3    Chief Complaint  Patient presents with   Follow-up   Coronary Artery Disease    History of Present Illness:    BJ MORLOCK is a 56 y.o. male with a hx of CAD HTN, HLD, bipolar disorder, COPD and tobacco use  last seen 12/24/2020.  He was admitted to [facility] 12/22/2020 and discharged the next day.  He was seen by me clinically had troponin normal angina underwent a myocardial perfusion study which  showed a moderate reversible defect in the lateral wall.  EF 55%. He had an echocardiogram at [facility] 12/22/2020 showed mild concentric LVH EF normal 55 to 96% grade 1 diastolic dysfunction normal left atrial size and no significant valvular abnormality.  Compliance with diet, lifestyle and medications: Yes  Is intolerant of oral mononitrate with tongue swelling He has had no angina times of self short of breath with more than usual activities no edema orthopnea palpitation or syncope Tells me he is pending back surgery for herniated disc   LHC 12/24/20     1st Diag lesion is 50% stenosed.   Prox RCA to Mid RCA lesion is 100% stenosed.   Prox Cx lesion is 20% stenosed.   Mid Cx lesion is 20% stenosed.   Moderate multivessel CAD with with 50% stenosis in the first diagonal branch of the LAD with otherwise normal-appearing LAD; nonobstructive 20% stenoses in the circumflex vessel; and chronic appearing total occlusion of the proximal RCA with antegrade bridging collateralization as well as retrograde left to right collateralization to the distal vessel.   Normal LV contractility with EF estimated 50 to 55% without definitive segmental wall motion abnormalities.  LVEDP 10 mmHg.   RECOMMENDATION: Medical therapy.  Patient was bradycardic at [facility].  Consider adding isosorbide and/or amlodipine for anti-ischemic benefit.  Aggressive lipid-lowering therapy with target LDL less than 70.   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 ICD-10 cut over Formatting of this note might be different from the original. ICD-10 cut over   Hyperlipidemia 12/24/2020   Hypertension    Obesity (BMI 30.0-34.9) 01/22/2012   OSA (obstructive sleep apnea)    Pain syndrome, chronic 02/22/2012   Postlaminectomy syndrome, lumbar region 01/07/2009   Radiculopathy of cervical spine 05/12/2020   Right arm pain 04/22/2020   Tobacco use 12/24/2020    Past Surgical History:  Procedure Laterality Date   ANTERIOR CERVICAL DECOMP/DISCECTOMY FUSION N/A 05/12/2020   Procedure: Anterior Cervical Decompression Fusion - Cervical five-Cervical six, removal of Cervical six-seven plate;  Surgeon: Vallarie Mare, MD;  Location: [place];  Service: Neurosurgery;  Laterality: N/A;   ANTERIOR FUSION CERVICAL SPINE     BACK SURGERY     CARPAL TUNNEL RELEASE Right    COLONOSCOPY W/ POLYPECTOMY     LEFT HEART CATH AND CORONARY ANGIOGRAPHY N/A 12/24/2020   Procedure: LEFT HEART CATH AND CORONARY ANGIOGRAPHY;  Surgeon: Troy Sine, MD;  Location: [place] [facility];  Service: Cardiovascular;  Laterality: N/A;   LUMBAR NERVE STIMLATOR INSERTION  10/2010   And Cervical nerve stimulator   SHOULDER ARTHROSCOPY Left    cleaned up after accident    Current Medications: Current Meds  Medication Sig   acetaminophen (TYLENOL) 650 MG CR tablet Take 1,300 mg by mouth every 8 (eight) hours as needed for pain.   albuterol (VENTOLIN HFA) 108 (90 Base) MCG/ACT inhaler Inhale 2 puffs into the lungs 2 (two) times daily.   amLODipine (NORVASC) 2.5 MG tablet Take 1 tablet (2.5 mg total) by mouth daily.   aspirin EC 81 MG tablet Take 81 mg by mouth at bedtime. Swallow whole.   DULoxetine (CYMBALTA) 60 MG capsule Take 60 mg by mouth every morning.   ezetimibe (ZETIA) 10 MG tablet Take 10 mg  by mouth at bedtime.   fenofibrate (TRICOR) 145 MG tablet Take 145 mg by mouth at bedtime.   fluticasone (FLONASE) 50 MCG/ACT nasal spray Place 1-2 sprays into both nostrils daily as needed for allergies or rhinitis.   magnesium oxide (MAG-OX) 400 MG tablet Take 400 mg by mouth daily.   montelukast (SINGULAIR) 10 MG tablet Take 10 mg by mouth every morning.   nitroGLYCERIN (NITROSTAT) 0.4 MG SL tablet Place 1 tablet (0.4 mg total) under the tongue every 5 (five) minutes x 3 doses as needed for chest pain.   omeprazole (PRILOSEC) 40 MG capsule Take 40 mg by mouth 2 (two) times daily.   rosuvastatin (CRESTOR) 40 MG tablet Take 1 tablet (40 mg total) by mouth daily at 6 PM.   SPIRIVA RESPIMAT 1.25 MCG/ACT AERS Inhale 2 puffs into the lungs daily.   tiZANidine (ZANAFLEX) 2 MG tablet Take 2 mg by mouth at bedtime.   traZODone (DESYREL) 100 MG tablet Take 100-200 mg by mouth at bedtime.   valsartan (DIOVAN)  80 MG tablet Take 80 mg by mouth every morning.   VASCEPA 1 g capsule Take 2 g by mouth 2 (two) times daily.   vitamin B-12 (CYANOCOBALAMIN) 1000 MCG tablet Take 1,000 mcg by mouth daily.     Allergies:   Imdur [isosorbide nitrate], Pregabalin, Aspirin, Bupropion, Gabapentin, Isosorbide, and Prednisone   Social History   Socioeconomic History   Marital status: Married    Spouse name: Not on file   Number of children: Not on file   Years of education: Not on file   Highest education level: Not on file  Occupational History   Not on file  Tobacco Use   Smoking status: Every Day    Packs/day: 1.00    Years: 35.00    Pack years: 35.00    Types: Cigarettes   Smokeless tobacco: Never  Vaping Use   Vaping Use: Never used  Substance and Sexual Activity   Alcohol use: Not Currently   Drug use: Not Currently   Sexual activity: Not on file  Other Topics Concern   Not on file  Social History Narrative   Not on file   Social Determinants of Health   Financial Resource Strain: Not on  file  Food Insecurity: Food Insecurity Present   Worried About [place] in the Last Year: Sometimes true   Ran Out of Food in the Last Year: Sometimes true  Transportation Needs: No Transportation Needs   Lack of Transportation (Medical): No   Lack of Transportation (Non-Medical): No  Physical Activity: Not on file  Stress: Not on file  Social Connections: Not on file     Family History: The patient's family history includes Heart attack in his maternal aunt; Hypertension in his brother, mother, and sister. ROS:   Please see the history of present illness.    All other systems reviewed and are negative.  EKGs/Labs/Other Studies Reviewed:    The following studies were reviewed today:  EKG:  EKG ordered today and personally reviewed.  The ekg ordered today demonstrates sinus rhythm normal EKG  Recent Labs: 12/24/2020: BUN 12; Creatinine, Ser 1.39; Hemoglobin 15.2; Platelets 168; Potassium 3.2; Sodium 136  Recent Lipid Panel    Component Value Date/Time   CHOL 140 12/24/2020 0128   TRIG 256 (H) 12/24/2020 0128   HDL 35 (L) 12/24/2020 0128   CHOLHDL 4.0 12/24/2020 0128   VLDL 51 (H) 12/24/2020 0128   LDLCALC 54 12/24/2020 0128    Physical Exam:    VS:  BP 108/72 (BP Location: Right Arm, Patient Position: Sitting)    Pulse 62    Ht 5\' 9"  (1.753 m)    Wt 221 lb (100.2 kg)    SpO2 90%    BMI 32.64 kg/m     Wt Readings from Last 3 Encounters:  03/01/21 221 lb (100.2 kg)  12/28/20 220 lb (99.8 kg)  12/24/20 215 lb 2.7 oz (97.6 kg)     GEN:  Well nourished, well developed in no acute distress HEENT: Normal NECK: No JVD; No carotid bruits LYMPHATICS: No lymphadenopathy CARDIAC: RRR, no murmurs, rubs, gallops RESPIRATORY:  Clear to auscultation without rales, wheezing or rhonchi  ABDOMEN: Soft, non-tender, non-distended MUSCULOSKELETAL:  No edema; No deformity  SKIN: Warm and dry NEUROLOGIC:  Alert and oriented x 3 PSYCHIATRIC:  Normal affect     Signed, Shirlee More, MD  03/01/2021 1:53 PM    [facility]

## 2021-03-01 ENCOUNTER — Other Ambulatory Visit: Payer: Self-pay

## 2021-03-01 ENCOUNTER — Encounter: Payer: Self-pay | Admitting: Cardiology

## 2021-03-01 ENCOUNTER — Ambulatory Visit (INDEPENDENT_AMBULATORY_CARE_PROVIDER_SITE_OTHER): Payer: Medicare Other | Admitting: Cardiology

## 2021-03-01 VITALS — BP 108/72 | HR 62 | Ht 69.0 in | Wt 221.0 lb

## 2021-03-01 DIAGNOSIS — R001 Bradycardia, unspecified: Secondary | ICD-10-CM

## 2021-03-01 DIAGNOSIS — E782 Mixed hyperlipidemia: Secondary | ICD-10-CM | POA: Diagnosis not present

## 2021-03-01 DIAGNOSIS — I10 Essential (primary) hypertension: Secondary | ICD-10-CM

## 2021-03-01 DIAGNOSIS — J449 Chronic obstructive pulmonary disease, unspecified: Secondary | ICD-10-CM | POA: Diagnosis not present

## 2021-03-01 DIAGNOSIS — I25118 Atherosclerotic heart disease of native coronary artery with other forms of angina pectoris: Secondary | ICD-10-CM | POA: Diagnosis not present

## 2021-03-01 MED ORDER — AMLODIPINE BESYLATE 2.5 MG PO TABS: 2.5000 mg | ORAL_TABLET | Freq: Every day | ORAL | 3 refills | Status: DC

## 2021-03-01 NOTE — Patient Instructions (Signed)
Medication Instructions:  Your physician has recommended you make the following change in your medication:  START: Amlodipine 2.5 mg take one tablet by mouth daily.  *If you need a refill on your cardiac medications before your next appointment, please call your pharmacy*   Lab Work: None If you have labs (blood work) drawn today and your tests are completely normal, you will receive your results only by: [place] (if you have MyChart) OR A paper copy in the mail If you have any lab test that is abnormal or we need to change your treatment, we will call you to review the results.   Testing/Procedures: None   Follow-Up: At [facility], you and your health needs are our priority.  As part of our continuing mission to provide you with exceptional heart care, we have created designated Provider Care Teams.  These Care Teams include your primary Cardiologist (physician) and Advanced Practice Providers (APPs -  Physician Assistants and Nurse Practitioners) who all work together to provide you with the care you need, when you need it.  We recommend signing up for the patient portal called "MyChart".  Sign up information is provided on this After Visit Summary.  MyChart is used to connect with patients for Virtual Visits (Telemedicine).  Patients are able to view lab/test results, encounter notes, upcoming appointments, etc.  Non-urgent messages can be sent to your provider as well.   To learn more about what you can do with MyChart, go to [email or web address].    Your next appointment:   6 month(s)  The format for your next appointment:   In Person  Provider:   Shirlee More, MD    Other Instructions

## 2021-04-29 DIAGNOSIS — E782 Mixed hyperlipidemia: Secondary | ICD-10-CM | POA: Diagnosis not present

## 2021-04-29 DIAGNOSIS — I10 Essential (primary) hypertension: Secondary | ICD-10-CM | POA: Diagnosis not present

## 2021-05-16 DIAGNOSIS — M5416 Radiculopathy, lumbar region: Secondary | ICD-10-CM | POA: Diagnosis not present

## 2021-05-23 DIAGNOSIS — M5416 Radiculopathy, lumbar region: Secondary | ICD-10-CM | POA: Diagnosis not present

## 2021-05-23 DIAGNOSIS — M5126 Other intervertebral disc displacement, lumbar region: Secondary | ICD-10-CM | POA: Diagnosis not present

## 2021-05-29 DIAGNOSIS — K219 Gastro-esophageal reflux disease without esophagitis: Secondary | ICD-10-CM | POA: Diagnosis not present

## 2021-05-29 DIAGNOSIS — I10 Essential (primary) hypertension: Secondary | ICD-10-CM | POA: Diagnosis not present

## 2021-05-29 DIAGNOSIS — E782 Mixed hyperlipidemia: Secondary | ICD-10-CM | POA: Diagnosis not present

## 2021-06-06 DIAGNOSIS — M5416 Radiculopathy, lumbar region: Secondary | ICD-10-CM | POA: Diagnosis not present

## 2021-06-30 DIAGNOSIS — M5416 Radiculopathy, lumbar region: Secondary | ICD-10-CM | POA: Diagnosis not present

## 2021-08-15 DIAGNOSIS — I7772 Dissection of iliac artery: Secondary | ICD-10-CM | POA: Diagnosis not present

## 2021-08-15 DIAGNOSIS — Z79899 Other long term (current) drug therapy: Secondary | ICD-10-CM | POA: Diagnosis not present

## 2021-08-15 DIAGNOSIS — K37 Unspecified appendicitis: Secondary | ICD-10-CM | POA: Diagnosis not present

## 2021-08-15 DIAGNOSIS — Z7982 Long term (current) use of aspirin: Secondary | ICD-10-CM | POA: Diagnosis not present

## 2021-08-15 DIAGNOSIS — J449 Chronic obstructive pulmonary disease, unspecified: Secondary | ICD-10-CM | POA: Diagnosis not present

## 2021-08-15 DIAGNOSIS — M1990 Unspecified osteoarthritis, unspecified site: Secondary | ICD-10-CM | POA: Diagnosis not present

## 2021-08-15 DIAGNOSIS — Z23 Encounter for immunization: Secondary | ICD-10-CM | POA: Diagnosis not present

## 2021-08-15 DIAGNOSIS — R1031 Right lower quadrant pain: Secondary | ICD-10-CM | POA: Diagnosis not present

## 2021-08-15 DIAGNOSIS — E7800 Pure hypercholesterolemia, unspecified: Secondary | ICD-10-CM | POA: Diagnosis not present

## 2021-08-15 DIAGNOSIS — R001 Bradycardia, unspecified: Secondary | ICD-10-CM | POA: Diagnosis not present

## 2021-08-15 DIAGNOSIS — K3580 Unspecified acute appendicitis: Secondary | ICD-10-CM | POA: Diagnosis not present

## 2021-08-15 DIAGNOSIS — F17210 Nicotine dependence, cigarettes, uncomplicated: Secondary | ICD-10-CM | POA: Diagnosis not present

## 2021-08-15 DIAGNOSIS — I10 Essential (primary) hypertension: Secondary | ICD-10-CM | POA: Diagnosis not present

## 2021-08-15 DIAGNOSIS — I2510 Atherosclerotic heart disease of native coronary artery without angina pectoris: Secondary | ICD-10-CM | POA: Diagnosis not present

## 2021-08-15 DIAGNOSIS — K219 Gastro-esophageal reflux disease without esophagitis: Secondary | ICD-10-CM | POA: Diagnosis not present

## 2021-08-15 DIAGNOSIS — K353 Acute appendicitis with localized peritonitis, without perforation or gangrene: Secondary | ICD-10-CM | POA: Diagnosis not present

## 2021-08-15 DIAGNOSIS — R109 Unspecified abdominal pain: Secondary | ICD-10-CM | POA: Diagnosis not present

## 2021-08-23 DIAGNOSIS — Z9049 Acquired absence of other specified parts of digestive tract: Secondary | ICD-10-CM | POA: Diagnosis not present

## 2021-08-23 DIAGNOSIS — I25119 Atherosclerotic heart disease of native coronary artery with unspecified angina pectoris: Secondary | ICD-10-CM | POA: Diagnosis not present

## 2021-08-23 DIAGNOSIS — I739 Peripheral vascular disease, unspecified: Secondary | ICD-10-CM | POA: Diagnosis not present

## 2021-08-29 ENCOUNTER — Ambulatory Visit (INDEPENDENT_AMBULATORY_CARE_PROVIDER_SITE_OTHER): Payer: Medicare Other | Admitting: Cardiology

## 2021-08-29 ENCOUNTER — Encounter: Payer: Self-pay | Admitting: Cardiology

## 2021-08-29 VITALS — BP 118/80 | HR 58 | Ht 69.0 in | Wt 211.0 lb

## 2021-08-29 DIAGNOSIS — I10 Essential (primary) hypertension: Secondary | ICD-10-CM | POA: Diagnosis not present

## 2021-08-29 DIAGNOSIS — J449 Chronic obstructive pulmonary disease, unspecified: Secondary | ICD-10-CM

## 2021-08-29 DIAGNOSIS — E782 Mixed hyperlipidemia: Secondary | ICD-10-CM

## 2021-08-29 DIAGNOSIS — I25118 Atherosclerotic heart disease of native coronary artery with other forms of angina pectoris: Secondary | ICD-10-CM

## 2021-08-29 NOTE — Progress Notes (Signed)
Cardiology Office Note:    Date:  08/29/2021   ID:  Jimmy Morrison, DOB January 07, 1966, MRN [id number]  PCP:  Mateo Flow, MD  Cardiologist:  Shirlee More, MD    Referring MD: Mateo Flow, MD    ASSESSMENT:    1. Coronary artery disease involving native coronary artery of native heart with other form of angina pectoris ([place])   2. Primary hypertension   3. Mixed hyperlipidemia   4. Chronic obstructive pulmonary disease, unspecified COPD type ([place])    PLAN:    In order of problems listed above:  Stable CAD continue medical therapy including aspirin calcium channel blocker and multiagent lipid-lowering treatment including rosuvastatin Zetia icosapent ethyl and fenofibric.  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  Concerns regarding medicines are outlined above.  No orders of the defined types were placed in this encounter.  No orders of the defined types were placed in this encounter.   Chief Complaint  Patient presents with   Follow-up   Coronary Artery Disease    History of Present Illness:    Jimmy Morrison is a 56 y.o. male with a hx of CAD hypertension hyperlipidemia COPD and tobacco use and bipolar disorder last seen 03/01/2021.  He had left heart catheterization 12/24/2020 showing moderate multivessel CAD with 50% stenosis in first diagonal branch and otherwise normal-appearing LAD and nonobstructive stenoses in the left circumflex coronary artery with normal left ventricular systolic function advised medical treatment.He was admitted to [facility] 12/22/2020 and discharged the next day.  He was seen by me  at [facility] and clinically had troponin normal unstable angina underwent a myocardial perfusion study which showed a moderate reversible defect in the lateral wall  EF 55%.  He had an echocardiogram at [facility] 12/22/2020 showed mild concentric LVH EF normal 55 to 40% grade 1 diastolic dysfunction normal left atrial size and no significant valvular abnormality.  Compliance with diet, lifestyle and medications: Yes  Recent surgery for appendicitis.  CT scan showed extensive aortoiliac disease and chronic dissection CIA bilaterally. He is meeting with the surgeon tomorrow.  He expects a referral to vascular surgery. He has buttock and thigh claudication walking perhaps 140 not having rest pain He has nonanginal chest pain has not needed nitroglycerin He is on combined lipid-lowering therapy with high intensity statin and Zetia and icosapent ethyl. 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 ICD-10 cut over Formatting of this note might be different from  the original. ICD-10 cut over   Hyperlipidemia 12/24/2020   Hypertension    Obesity (BMI 30.0-34.9) 01/22/2012   OSA (obstructive sleep apnea)    Pain syndrome, chronic 02/22/2012   Postlaminectomy syndrome, lumbar region 01/07/2009   Radiculopathy of cervical spine 05/12/2020    Right arm pain 04/22/2020   Tobacco use 12/24/2020    Past Surgical History:  Procedure Laterality Date   ANTERIOR CERVICAL DECOMP/DISCECTOMY FUSION N/A 05/12/2020   Procedure: Anterior Cervical Decompression Fusion - Cervical five-Cervical six, removal of Cervical six-seven plate;  Surgeon: Vallarie Mare, MD;  Location: [place];  Service: Neurosurgery;  Laterality: N/A;   ANTERIOR FUSION CERVICAL SPINE     BACK SURGERY     CARPAL TUNNEL RELEASE Right    COLONOSCOPY W/ POLYPECTOMY     LEFT HEART CATH AND CORONARY ANGIOGRAPHY N/A 12/24/2020   Procedure: LEFT HEART CATH AND CORONARY ANGIOGRAPHY;  Surgeon: Troy Sine, MD;  Location: [place] [facility];  Service: Cardiovascular;  Laterality: N/A;   LUMBAR NERVE STIMLATOR INSERTION  10/2010   And Cervical nerve stimulator   SHOULDER ARTHROSCOPY Left    cleaned up after accident    Current Medications: Current Meds  Medication Sig   acetaminophen (TYLENOL) 650 MG CR tablet Take 1,300 mg by mouth every 8 (eight) hours as needed for pain.   albuterol (VENTOLIN HFA) 108 (90 Base) MCG/ACT inhaler Inhale 2 puffs into the lungs 2 (two) times daily.   amLODipine (NORVASC) 2.5 MG tablet Take 1 tablet (2.5 mg total) by mouth daily.   aspirin EC 81 MG tablet Take 81 mg by mouth at bedtime. Swallow whole.   DULoxetine (CYMBALTA) 60 MG capsule Take 60 mg by mouth every morning.   ezetimibe (ZETIA) 10 MG tablet Take 10 mg by mouth at bedtime.   fenofibrate (TRICOR) 145 MG tablet Take 145 mg by mouth at bedtime.   fluticasone (FLONASE) 50 MCG/ACT nasal spray Place 1-2 sprays into both nostrils daily as needed for allergies or rhinitis.   magnesium oxide (MAG-OX) 400 MG tablet Take 400 mg by mouth daily.   montelukast (SINGULAIR) 10 MG tablet Take 10 mg by mouth every morning.   nitroGLYCERIN (NITROSTAT) 0.4 MG SL tablet Place 1 tablet (0.4 mg total) under the tongue every 5 (five) minutes x 3 doses as needed for chest pain.   omeprazole  (PRILOSEC) 40 MG capsule Take 40 mg by mouth 2 (two) times daily.   rosuvastatin (CRESTOR) 40 MG tablet Take 1 tablet (40 mg total) by mouth daily at 6 PM.   SPIRIVA RESPIMAT 1.25 MCG/ACT AERS Inhale 2 puffs into the lungs daily.   tiZANidine (ZANAFLEX) 2 MG tablet Take 2 mg by mouth at bedtime.   traZODone (DESYREL) 100 MG tablet Take 100-200 mg by mouth at bedtime.   valsartan (DIOVAN) 80 MG tablet Take 80 mg by mouth every morning.   VASCEPA 1 g capsule Take 2 g by mouth 2 (two) times daily.   vitamin B-12 (CYANOCOBALAMIN) 1000 MCG tablet Take 1,000 mcg by mouth daily.     Allergies:   Imdur [isosorbide nitrate], Pregabalin, Aspirin, Bupropion, Gabapentin, Isosorbide, and Prednisone   Social History   Socioeconomic History   Marital status: Married    Spouse name: Not on file   Number of children: Not on file   Years of education: Not on file   Highest education level: Not on file  Occupational History   Not on file  Tobacco Use   Smoking status:  Every Day    Packs/day: 1.00    Years: 35.00    Total pack years: 35.00    Types: Cigarettes   Smokeless tobacco: Never  Vaping Use   Vaping Use: Never used  Substance and Sexual Activity   Alcohol use: Not Currently   Drug use: Not Currently   Sexual activity: Not on file  Other Topics Concern   Not on file  Social History Narrative   Not on file   Social Determinants of Health   Financial Resource Strain: Not on file  Food Insecurity: Food Insecurity Present (03/04/2020)   Hunger Vital Sign    Worried About [place] in the Last Year: Sometimes true    Ran Out of Food in the Last Year: Sometimes true  Transportation Needs: No Transportation Needs (03/04/2020)   PRAPARE - Hydrologist (Medical): No    Lack of Transportation (Non-Medical): No  Physical Activity: Not on file  Stress: Not on file  Social Connections: Not on file     Family History: The patient's family history  includes Heart attack in his maternal aunt; Hypertension in his brother, mother, and sister. ROS:   Please see the history of present illness.    All other systems reviewed and are negative.  EKGs/Labs/Other Studies Reviewed:    The following studies were reviewed today: LHC 01-22-21     1st Diag lesion is 50% stenosed.   Prox RCA to Mid RCA lesion is 100% stenosed.   Prox Cx lesion is 20% stenosed.   Mid Cx lesion is 20% stenosed.   Moderate multivessel CAD with with 50% stenosis in the first diagonal branch of the LAD with otherwise normal-appearing LAD; nonobstructive 20% stenoses in the circumflex vessel; and chronic appearing total occlusion of the proximal RCA with antegrade bridging collateralization as well as retrograde left to right collateralization to the distal vessel.   Normal LV contractility with EF estimated 50 to 55% without definitive segmental wall motion abnormalities.  LVEDP 10 mmHg.   RECOMMENDATION: Medical therapy.  Patient was br[facility]cardic at [facility].  Consider adding isosorbide and/or amlodipine for anti-ischemic benefit.  Aggressive lipid-lowering therapy with target LDL less than 70.   Coronary Diagrams   Diagnostic Dominance: Right         EKG:  EKG ordered today and personally reviewed.  The ekg ordered today demonstrates sinus rhythm and is normal  Recent Labs: 2021-01-22: BUN 12; Creatinine, Ser 1.39; Hemoglobin 15.2; Platelets 168; Potassium 3.2; Sodium 136  Recent Lipid Panel    Component Value Date/Time   CHOL 140 Jan 22, 2021 0128   TRIG 256 (H) 2021-01-22 0128   HDL 35 (L) 01-22-21 0128   CHOLHDL 4.0 22-Jan-2021 0128   VLDL 51 (H) January 22, 2021 0128   LDLCALC 54 01/22/21 0128    Physical Exam:    VS:  BP 118/80 (BP Location: Right Arm, Patient Position: Sitting, Cuff Size: Normal)   Pulse (!) 58   Ht '5\' 9"'$  (1.753 m)   Wt 211 lb (95.7 kg)   SpO2 91%   BMI 31.16 kg/m     Wt Readings from Last 3 Encounters:  08/29/21  211 lb (95.7 kg)  03/01/21 221 lb (100.2 kg)  12/28/20 220 lb (99.8 kg)     GEN:  Well nourished, well developed in no acute distress HEENT: Normal NECK: No JVD; No carotid bruits LYMPHATICS: No lymphadenopathy CARDIAC: RRR, no murmurs, rubs, gallops RESPIRATORY:  Clear to auscultation without rales,  wheezing or rhonchi  ABDOMEN: Soft, non-tender, non-distended MUSCULOSKELETAL:  No edema; No deformity  SKIN: Warm and dry NEUROLOGIC:  Alert and oriented x 3 PSYCHIATRIC:  Normal affect  Pedal pulses are present diminished rolling femoral pulses bilaterally no bruit   Signed, Shirlee More, MD  08/29/2021 2:10 PM    [facility]

## 2021-08-29 NOTE — Patient Instructions (Signed)
Medication Instructions:  Your physician recommends that you continue on your current medications as directed. Please refer to the Current Medication list given to you today.  *If you need a refill on your cardiac medications before your next appointment, please call your pharmacy*   Lab Work: Your physician recommends that you return for lab work in:   Labs today: CMP, Lipids, Lpa  If you have labs (blood work) drawn today and your tests are completely normal, you will receive your results only by: [place] (if you have [place]) OR A paper copy in the mail If you have any lab test that is abnormal or we need to change your treatment, we will call you to review the results.   Testing/Procedures: None   Follow-Up: At [facility], you and your health needs are our priority.  As part of our continuing mission to provide you with exceptional heart care, we have created designated Provider Care Teams.  These Care Teams include your primary Cardiologist (physician) and Advanced Practice Providers (APPs -  Physician Assistants and Nurse Practitioners) who all work together to provide you with the care you need, when you need it.  We recommend signing up for the patient portal called "MyChart".  Sign up information is provided on this After Visit Summary.  MyChart is used to connect with patients for Virtual Visits (Telemedicine).  Patients are able to view lab/test results, encounter notes, upcoming appointments, etc.  Non-urgent messages can be sent to your provider as well.   To learn more about what you can do with MyChart, go to [email or web address].    Your next appointment:   6 month(s)  The format for your next appointment:   In Person  Provider:   Shirlee More, MD    Other Instructions None  Important Information About [place]

## 2021-08-30 ENCOUNTER — Telehealth: Payer: Self-pay | Admitting: Cardiology

## 2021-08-30 DIAGNOSIS — I7772 Dissection of iliac artery: Secondary | ICD-10-CM

## 2021-08-30 HISTORY — DX: Dissection of iliac artery: I77.72

## 2021-08-30 LAB — COMPREHENSIVE METABOLIC PANEL
ALT: 9 IU/L (ref 0–44)
AST: 10 IU/L (ref 0–40)
Albumin/Globulin Ratio: 1.7 (ref 1.2–2.2)
Albumin: 4.3 g/dL (ref 3.8–4.9)
Alkaline Phosphatase: 50 IU/L (ref 44–121)
BUN/Creatinine Ratio: 7 — ABNORMAL LOW (ref 9–20)
BUN: 8 mg/dL (ref 6–24)
Bilirubin Total: 0.2 mg/dL (ref 0.0–1.2)
CO2: 20 mmol/L (ref 20–29)
Calcium: 9.9 mg/dL (ref 8.7–10.2)
Chloride: 104 mmol/L (ref 96–106)
Creatinine, Ser: 1.14 mg/dL (ref 0.76–1.27)
Globulin, Total: 2.5 g/dL (ref 1.5–4.5)
Glucose: 96 mg/dL (ref 70–99)
Potassium: 3.8 mmol/L (ref 3.5–5.2)
Sodium: 141 mmol/L (ref 134–144)
Total Protein: 6.8 g/dL (ref 6.0–8.5)
eGFR: 76 mL/min/{1.73_m2} (ref >59)

## 2021-08-30 LAB — LIPID PANEL
Chol/HDL Ratio: 2.5 ratio (ref 0.0–5.0)
Cholesterol, Total: 99 mg/dL — ABNORMAL LOW (ref 100–199)
HDL: 39 mg/dL — ABNORMAL LOW (ref >39)
LDL Chol Calc (NIH): 39 mg/dL (ref 0–99)
Triglycerides: 116 mg/dL (ref 0–149)
VLDL Cholesterol Cal: 21 mg/dL (ref 5–40)

## 2021-08-30 LAB — LIPOPROTEIN A (LPA): Lipoprotein (a): 41.1 nmol/L (ref <75.0)

## 2021-08-30 NOTE — Telephone Encounter (Signed)
Follow Up:     Patient's wife is returning Richard's call from thiss morning, concerning his results.Marland Kitchen

## 2021-08-31 NOTE — Telephone Encounter (Signed)
Patient informed of results.  

## 2021-09-21 DIAGNOSIS — I771 Stricture of artery: Secondary | ICD-10-CM | POA: Diagnosis not present

## 2021-09-21 DIAGNOSIS — I7772 Dissection of iliac artery: Secondary | ICD-10-CM | POA: Diagnosis not present

## 2021-09-21 DIAGNOSIS — E785 Hyperlipidemia, unspecified: Secondary | ICD-10-CM | POA: Diagnosis not present

## 2021-09-21 DIAGNOSIS — I10 Essential (primary) hypertension: Secondary | ICD-10-CM | POA: Diagnosis not present

## 2021-09-21 DIAGNOSIS — I739 Peripheral vascular disease, unspecified: Secondary | ICD-10-CM | POA: Diagnosis not present

## 2021-09-21 DIAGNOSIS — Z720 Tobacco use: Secondary | ICD-10-CM | POA: Diagnosis not present

## 2021-10-29 DIAGNOSIS — I10 Essential (primary) hypertension: Secondary | ICD-10-CM | POA: Diagnosis not present

## 2021-10-29 DIAGNOSIS — E782 Mixed hyperlipidemia: Secondary | ICD-10-CM | POA: Diagnosis not present

## 2021-10-29 DIAGNOSIS — K219 Gastro-esophageal reflux disease without esophagitis: Secondary | ICD-10-CM | POA: Diagnosis not present

## 2021-11-03 DIAGNOSIS — D485 Neoplasm of uncertain behavior of skin: Secondary | ICD-10-CM | POA: Diagnosis not present

## 2021-11-03 DIAGNOSIS — L821 Other seborrheic keratosis: Secondary | ICD-10-CM | POA: Diagnosis not present

## 2021-11-03 DIAGNOSIS — L814 Other melanin hyperpigmentation: Secondary | ICD-10-CM | POA: Diagnosis not present

## 2021-11-16 DIAGNOSIS — C44311 Basal cell carcinoma of skin of nose: Secondary | ICD-10-CM | POA: Diagnosis not present

## 2021-12-26 DIAGNOSIS — Z1211 Encounter for screening for malignant neoplasm of colon: Secondary | ICD-10-CM | POA: Diagnosis not present

## 2021-12-26 DIAGNOSIS — Z860100 Personal history of colon polyps, unspecified: Secondary | ICD-10-CM | POA: Insufficient documentation

## 2021-12-26 HISTORY — DX: Personal history of colon polyps, unspecified: Z86.0100

## 2021-12-29 ENCOUNTER — Telehealth: Payer: Self-pay

## 2021-12-29 ENCOUNTER — Telehealth: Payer: Self-pay | Admitting: Cardiology

## 2021-12-29 NOTE — Telephone Encounter (Signed)
Lvm for pt to call office to schedule telephone appt for surgical clearance.

## 2021-12-29 NOTE — Telephone Encounter (Signed)
Pt called back in to sch a VT preop appt Best contact number is [phone]

## 2021-12-29 NOTE — Telephone Encounter (Signed)
..     Pre-operative Risk Assessment    Patient Name: Jimmy Morrison  DOB: Mar 01, 1965 MRN: [id number]      Request for Surgical Clearance    Procedure:   COLONOSCOPY  Date of Surgery:  Clearance TBD                                 Surgeon:  DR Jerel Shepherd Surgeon's Group or Practice Name:  [facility] Phone number:  [phone] Fax number:  [phone]   Type of Clearance Requested:   - Medical  - Pharmacy:  Hold Aspirin     Type of Anesthesia:   PROPOFOL   Additional requests/questions:    Gwenlyn Found   12/29/2021, 12:49 PM

## 2021-12-29 NOTE — Telephone Encounter (Signed)
   Name: Jimmy Morrison  DOB: 03/29/1965  MRN: [id number]  Primary Cardiologist: None   Preoperative team, please contact this patient and set up a phone call appointment for further preoperative risk assessment. Please obtain consent and complete medication review. Thank you for your help.  I confirm that guidance regarding antiplatelet and oral anticoagulation therapy has been completed and, if necessary, noted below.  Given the patient's comorbid conditions, we prefer to continue ASA throughout the perioperative period. However, if doing so significantly increases morbidity or mortality, may hold for 5-7 days.    Deberah Pelton, NP 12/29/2021, 1:47 PM [facility]

## 2021-12-30 NOTE — Telephone Encounter (Signed)
Called pt back and left vm to call back to pre op team for a tele pre op appt.

## 2021-12-30 NOTE — Telephone Encounter (Signed)
See clearance notes  

## 2022-01-02 ENCOUNTER — Telehealth: Payer: Self-pay | Admitting: *Deleted

## 2022-01-02 NOTE — Telephone Encounter (Signed)
Pt has been scheduled for tele pre op appt 01/12/22 @ 2 pm. Med rec and consent are done.

## 2022-01-02 NOTE — Telephone Encounter (Signed)
Pt has been scheduled for tele pre op appt 01/12/22 @ 2 pm. Med rec and consent are done.     Patient Consent for Virtual Visit        Jimmy Morrison has provided verbal consent on 01/02/2022 for a virtual visit (video or telephone).   CONSENT FOR VIRTUAL VISIT FOR:  Jimmy Morrison  By participating in this virtual visit I agree to the following:  I hereby voluntarily request, consent and authorize [facility] and its employed or contracted physicians, physician assistants, nurse practitioners or other licensed health care professionals (the Practitioner), to provide me with telemedicine health care services (the "Services") as deemed necessary by the treating Practitioner. I acknowledge and consent to receive the Services by the Practitioner via telemedicine. I understand that the telemedicine visit will involve communicating with the Practitioner through live audiovisual communication technology and the disclosure of certain medical information by electronic transmission. I acknowledge that I have been given the opportunity to request an in-person assessment or other available alternative prior to the telemedicine visit and am voluntarily participating in the telemedicine visit.  I understand that I have the right to withhold or withdraw my consent to the use of telemedicine in the course of my care at any time, without affecting my right to future care or treatment, and that the Practitioner or I may terminate the telemedicine visit at any time. I understand that I have the right to inspect all information obtained and/or recorded in the course of the telemedicine visit and may receive copies of available information for a reasonable fee.  I understand that some of the potential risks of receiving the Services via telemedicine include:  Delay or interruption in medical evaluation due to technological equipment failure or disruption; Information transmitted may not be sufficient (e.g.  poor resolution of images) to allow for appropriate medical decision making by the Practitioner; and/or  In rare instances, security protocols could fail, causing a breach of personal health information.  Furthermore, I acknowledge that it is my responsibility to provide information about my medical history, conditions and care that is complete and accurate to the best of my ability. I acknowledge that Practitioner's advice, recommendations, and/or decision may be based on factors not within their control, such as incomplete or inaccurate data provided by me or distortions of diagnostic images or specimens that may result from electronic transmissions. I understand that the practice of medicine is not an exact science and that Practitioner makes no warranties or guarantees regarding treatment outcomes. I acknowledge that a copy of this consent can be made available to me via my patient portal ([facility]), or I can request a printed copy by calling the office of [facility].    I understand that my insurance will be billed for this visit.   I have read or had this consent read to me. I understand the contents of this consent, which adequately explains the benefits and risks of the Services being provided via telemedicine.  I have been provided ample opportunity to ask questions regarding this consent and the Services and have had my questions answered to my satisfaction. I give my informed consent for the services to be provided through the use of telemedicine in my medical care

## 2022-01-11 NOTE — Progress Notes (Unsigned)
Virtual Visit via Telephone Note   Because of Jimmy Morrison co-morbid illnesses, he is at least at moderate risk for complications without adequate follow up.  This format is felt to be most appropriate for this patient at this time.  The patient did not have access to video technology/had technical difficulties with video requiring transitioning to audio format only (telephone).  All issues noted in this document were discussed and addressed.  No physical exam could be performed with this format.  Please refer to the patient's chart for his consent to telehealth for [facility].  Evaluation Performed:  Preoperative cardiovascular risk assessment _____________   Date:  01/11/2022   Patient ID:  Jimmy Morrison, DOB 03-Oct-1965, MRN [id number] Patient Location:  Home Provider location:   Office  Primary Care Provider:  Mateo Flow, MD Primary Cardiologist:  Shirlee More, MD  Chief Complaint / Patient Profile   56 y.o. y/o male with a h/o HTN, HLD, COPD, tobacco use, CAD with cardiac catheterization 12/24/2020 showing moderate multivessel CAD with 50% stenosis in first diagonal branch and otherwise normal appearing LAD and nonobstructive stenosis in left circumflex with normal LV function.  Echocardiogram 12/22/2020 revealed normal LVEF 55 to 60%, G1 DD, no significant valvular abnormality who is pending colonoscopy and presents today for telephonic preoperative cardiovascular risk assessment.  History of Present Illness    Jimmy Morrison is a 56 y.o. male who presents via audio/video conferencing for a telehealth visit today.  Pt was last seen in cardiology clinic on 08/29/21 by Dr. Bettina Gavia.  At that time SHRAGA CUSTARD was doing well.  The patient is now pending procedure as outlined above. Since his last visit, he denies chest pain, shortness of breath, lower extremity edema, fatigue, palpitations, melena, hematuria, hemoptysis, diaphoresis, weakness, presyncope, syncope,  orthopnea, and PND. He reports he is not as active in the winter months as he is during the summer, however he can successfully achieve > 4 METS activity without concerning cardiac symptoms.   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 ICD-10 cut over Formatting of this note might be different from the original. ICD-10 cut over   Hyperlipidemia 12/24/2020   Hypertension    Obesity (BMI 30.0-34.9) 01/22/2012   OSA (obstructive sleep apnea)    Pain syndrome, chronic 02/22/2012   Postlaminectomy syndrome, lumbar region 01/07/2009   Radiculopathy of cervical spine 05/12/2020   Right arm pain 04/22/2020   Tobacco use 12/24/2020   Past Surgical History:  Procedure Laterality Date   ANTERIOR CERVICAL DECOMP/DISCECTOMY FUSION N/A 05/12/2020   Procedure: Anterior Cervical Decompression Fusion - Cervical five-Cervical six, removal of Cervical six-seven plate;  Surgeon: Vallarie Mare, MD;  Location: [facility];  Service: Neurosurgery;  Laterality: N/A;   ANTERIOR FUSION CERVICAL SPINE     BACK SURGERY  CARPAL TUNNEL RELEASE Right    COLONOSCOPY W/ POLYPECTOMY     LEFT HEART CATH AND CORONARY ANGIOGRAPHY N/A 12/24/2020   Procedure: LEFT HEART CATH AND CORONARY ANGIOGRAPHY;  Surgeon: Troy Sine, MD;  Location: [facility];  Service: Cardiovascular;  Laterality: N/A;   LUMBAR NERVE  STIMLATOR INSERTION  10/2010   And Cervical nerve stimulator   SHOULDER ARTHROSCOPY Left    cleaned up after accident    Allergies  Allergies  Allergen Reactions   Imdur [Isosorbide Nitrate] Other (See Comments)    Tongue swelling    Pregabalin     Bumps in mouth   Aspirin     Nose bleed   Bupropion     Bumps in mouth   Gabapentin Swelling    Tongue swelling   Isosorbide Other (See Comments)    Swollen tongue/ headaches   Prednisone     Mood changes    Home Medications    Prior to Admission medications   Medication Sig Start Date End Date Taking? Authorizing Provider  acetaminophen (TYLENOL) 650 MG CR tablet Take 1,300 mg by mouth every 8 (eight) hours as needed for pain.    [provider]  albuterol (VENTOLIN HFA) 108 (90 Base) MCG/ACT inhaler Inhale 2 puffs into the lungs 2 (two) times daily. 12/15/19   [provider]  amLODipine (NORVASC) 2.5 MG tablet Take 1 tablet (2.5 mg total) by mouth daily. 03/01/21 01/02/22  Richardo Priest, MD  aspirin EC 81 MG tablet Take 81 mg by mouth at bedtime. Swallow whole.    [provider]  DULoxetine (CYMBALTA) 60 MG capsule Take 60 mg by mouth every morning. 01/19/20   [provider]  ezetimibe (ZETIA) 10 MG tablet Take 10 mg by mouth at bedtime. 01/19/20   [provider]  fenofibrate (TRICOR) 145 MG tablet Take 145 mg by mouth at bedtime. 01/19/20   [provider]  fluticasone (FLONASE) 50 MCG/ACT nasal spray Place 1-2 sprays into both nostrils daily as needed for allergies or rhinitis.    [provider]  ipratropium-albuterol (DUONEB) 0.5-2.5 (3) MG/3ML SOLN Inhale 3 mLs into the lungs daily. 05/03/21   [provider]  magnesium oxide (MAG-OX) 400 MG tablet Take 400 mg by mouth daily.    [provider]  montelukast (SINGULAIR) 10 MG tablet Take 10 mg by mouth every morning. 01/19/20   [provider]  naloxone [facility]) nasal spray 4 mg/0.1  mL Place 1 spray into the nose Once PRN. 03/25/21   [provider]  nitroGLYCERIN (NITROSTAT) 0.4 MG SL tablet Place 1 tablet (0.4 mg total) under the tongue every 5 (five) minutes x 3 doses as needed for chest pain. 12/24/20   Furth, Cadence H, PA-C  omeprazole (PRILOSEC) 40 MG capsule Take 40 mg by mouth 2 (two) times daily. 01/19/20   [provider]  ondansetron (ZOFRAN-ODT) 4 MG disintegrating tablet Take 4 mg by mouth every 6 (six) hours as needed for nausea. 08/16/21   [provider]  oxyCODONE (OXY IR/ROXICODONE) 5 MG immediate release tablet Take 5 mg by mouth every 6 (six) hours as needed. 08/16/21   [provider]  rosuvastatin (CRESTOR) 40 MG tablet Take 1 tablet (40 mg total) by mouth daily at 6 PM. 12/24/20   Furth, Cadence H, PA-C  SPIRIVA RESPIMAT 1.25 MCG/ACT AERS Inhale 2 puffs into the lungs daily. 05/06/20   [provider]  tiZANidine (ZANAFLEX) 2 MG tablet Take 2 mg  by mouth at bedtime. 01/01/20   [provider]  traZODone (DESYREL) 100 MG tablet Take 100-200 mg by mouth at bedtime. 12/12/19   [provider]  valsartan (DIOVAN) 80 MG tablet Take 80 mg by mouth every morning. 01/19/20   [provider]  VASCEPA 1 g capsule Take 2 g by mouth 2 (two) times daily. 01/19/20   [provider]  vitamin B-12 (CYANOCOBALAMIN) 1000 MCG tablet Take 1,000 mcg by mouth daily.    [provider]    Physical Exam    Vital Signs:  TREVIOUS RAMPEY does not have vital signs available for review today.  Given telephonic nature of communication, physical exam is limited. AAOx3. NAD. Normal affect.  Speech and respirations are unlabored.  Accessory Clinical Findings    None  Assessment & Plan    1.  Preoperative Cardiovascular Risk Assessment: The patient is doing well from a cardiac perspective. Therefore, based on ACC/AHA guidelines, the patient would be at acceptable risk for the planned procedure  without further cardiovascular testing. According to the Revised Cardiac Risk Index (RCRI), his Perioperative Risk of Major Cardiac Event is (%): 0.9 His Functional Capacity in METs is: 7.59 according to the [facility] Activity Status Index (DASI).  The patient was advised that if he develops new symptoms prior to surgery to contact our office to arrange for a follow-up visit, and he verbalized understanding.  Aspirin may be held for 5-7 days prior to procedure and should be resumed as soon as hemodynamically stable following the procedure.   A copy of this note will be routed to requesting surgeon.  Time:   Today, I have spent 6 minutes with the patient with telehealth technology discussing medical history, symptoms, and management plan.     Emmaline Life, NP-C  01/12/2022, 2:00 PM [address] Office [phone] Fax [phone]

## 2022-01-12 ENCOUNTER — Encounter: Payer: Self-pay | Admitting: Nurse Practitioner

## 2022-01-12 ENCOUNTER — Ambulatory Visit: Payer: Medicare Other | Attending: Internal Medicine | Admitting: Nurse Practitioner

## 2022-01-12 DIAGNOSIS — Z01810 Encounter for preprocedural cardiovascular examination: Secondary | ICD-10-CM

## 2022-01-19 DIAGNOSIS — Z1211 Encounter for screening for malignant neoplasm of colon: Secondary | ICD-10-CM | POA: Diagnosis not present

## 2022-01-19 DIAGNOSIS — D122 Benign neoplasm of ascending colon: Secondary | ICD-10-CM | POA: Diagnosis not present

## 2022-01-19 DIAGNOSIS — K635 Polyp of colon: Secondary | ICD-10-CM | POA: Diagnosis not present

## 2022-01-19 DIAGNOSIS — F17210 Nicotine dependence, cigarettes, uncomplicated: Secondary | ICD-10-CM | POA: Diagnosis not present

## 2022-01-19 DIAGNOSIS — Z86010 Personal history of colonic polyps: Secondary | ICD-10-CM | POA: Diagnosis not present

## 2022-01-25 DIAGNOSIS — J449 Chronic obstructive pulmonary disease, unspecified: Secondary | ICD-10-CM | POA: Diagnosis not present

## 2022-01-25 DIAGNOSIS — I10 Essential (primary) hypertension: Secondary | ICD-10-CM | POA: Diagnosis not present

## 2022-01-25 DIAGNOSIS — E782 Mixed hyperlipidemia: Secondary | ICD-10-CM | POA: Diagnosis not present

## 2022-01-25 DIAGNOSIS — F17200 Nicotine dependence, unspecified, uncomplicated: Secondary | ICD-10-CM | POA: Diagnosis not present

## 2022-01-25 DIAGNOSIS — M961 Postlaminectomy syndrome, not elsewhere classified: Secondary | ICD-10-CM | POA: Diagnosis not present

## 2022-02-07 DIAGNOSIS — C44311 Basal cell carcinoma of skin of nose: Secondary | ICD-10-CM | POA: Diagnosis not present

## 2022-02-20 DIAGNOSIS — F17200 Nicotine dependence, unspecified, uncomplicated: Secondary | ICD-10-CM | POA: Diagnosis not present

## 2022-02-20 DIAGNOSIS — M791 Myalgia, unspecified site: Secondary | ICD-10-CM | POA: Diagnosis not present

## 2022-02-20 DIAGNOSIS — J329 Chronic sinusitis, unspecified: Secondary | ICD-10-CM | POA: Diagnosis not present

## 2022-02-20 DIAGNOSIS — J40 Bronchitis, not specified as acute or chronic: Secondary | ICD-10-CM | POA: Diagnosis not present

## 2022-02-28 DIAGNOSIS — J441 Chronic obstructive pulmonary disease with (acute) exacerbation: Secondary | ICD-10-CM | POA: Diagnosis not present

## 2022-02-28 DIAGNOSIS — R55 Syncope and collapse: Secondary | ICD-10-CM | POA: Diagnosis not present

## 2022-02-28 DIAGNOSIS — H539 Unspecified visual disturbance: Secondary | ICD-10-CM | POA: Diagnosis not present

## 2022-03-15 DIAGNOSIS — R55 Syncope and collapse: Secondary | ICD-10-CM | POA: Diagnosis not present

## 2022-03-17 DIAGNOSIS — B370 Candidal stomatitis: Secondary | ICD-10-CM | POA: Diagnosis not present

## 2022-03-17 DIAGNOSIS — I10 Essential (primary) hypertension: Secondary | ICD-10-CM | POA: Diagnosis not present

## 2022-04-18 DIAGNOSIS — H524 Presbyopia: Secondary | ICD-10-CM | POA: Diagnosis not present

## 2022-04-18 DIAGNOSIS — H527 Unspecified disorder of refraction: Secondary | ICD-10-CM | POA: Diagnosis not present

## 2022-04-30 DIAGNOSIS — K219 Gastro-esophageal reflux disease without esophagitis: Secondary | ICD-10-CM | POA: Diagnosis not present

## 2022-04-30 DIAGNOSIS — I10 Essential (primary) hypertension: Secondary | ICD-10-CM | POA: Diagnosis not present

## 2022-04-30 DIAGNOSIS — E782 Mixed hyperlipidemia: Secondary | ICD-10-CM | POA: Diagnosis not present

## 2022-05-17 IMAGING — RF DG CERVICAL SPINE 2 OR 3 VIEWS
1 series · 2 of 2 positions shown · non-contrast
Comparison: MRI of the cervical spine 04/27/2020.

CLINICAL DATA: Surgery, elective. Additional history provided: C5-6
ACDF. Provided fluoroscopy time 13 seconds (1.66 mGy).

EXAM:
CERVICAL SPINE - 2-3 VIEW; DG C-ARM 1-60 MIN

[Series 1: run · 2 of 2 slices shown]
[im 1/2]
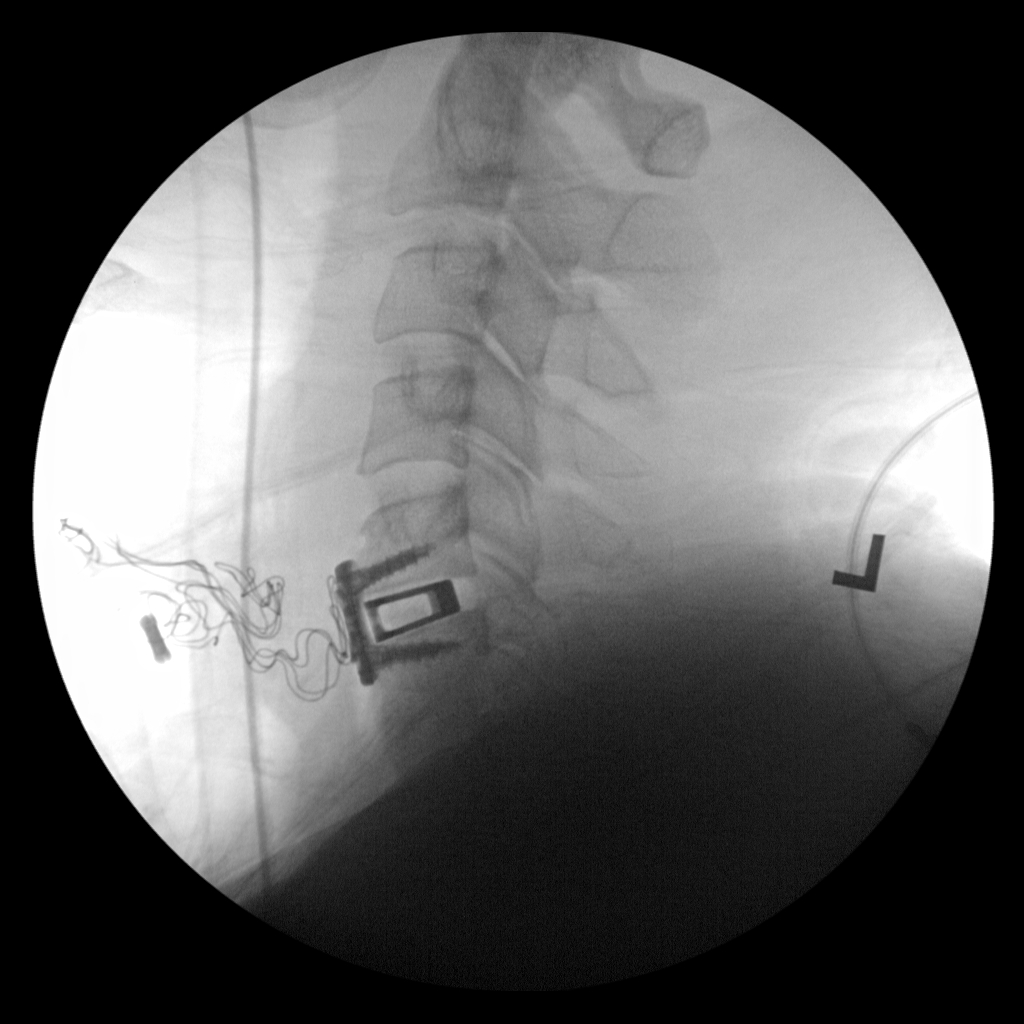
[im 2/2]
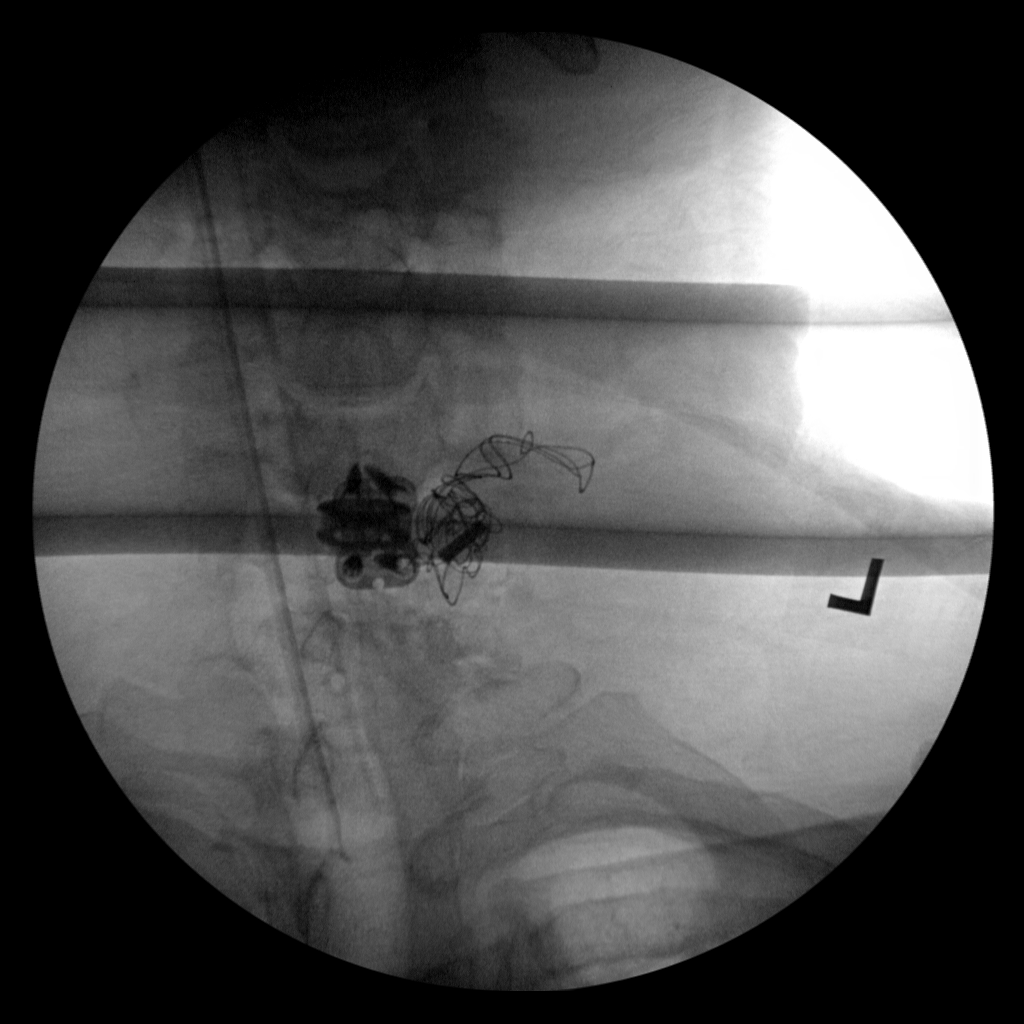

[2 of 2 positions shown; findings below may reference images not displayed]

FINDINGS: PA and lateral view intraoperative fluoroscopic images of the
cervical spine are submitted, 2 images total. The images demonstrate
new ACDF hardware at the C5-C6 level (ventral plate and screws as
well as interbody device). Previously demonstrated ACDF hardware
spanning the C6-C7 level no longer present. Curvilinear hyperdensity
projects ventral to the cervical spine at the C5-C6 level, likely
reflecting a surgical sponge/packing material. Partially visualized
ET tube.
IMPRESSION: Two intraoperative fluoroscopic images of the cervical spine from
C5-C6 ACDF, as described.

Curvilinear hyperdensity projects ventral to the cervical spine at
the C5-C6 level, likely reflecting a surgical sponge/packing
material. Correlate with the operative history.

## 2022-05-17 IMAGING — RF DG C-ARM 1-60 MIN
1 series · 2 of 2 positions shown · non-contrast
Comparison: MRI of the cervical spine 04/27/2020.

CLINICAL DATA: Surgery, elective. Additional history provided: C5-6
ACDF. Provided fluoroscopy time 13 seconds (1.66 mGy).

EXAM:
CERVICAL SPINE - 2-3 VIEW; DG C-ARM 1-60 MIN

[Series 1: run · 2 of 2 slices shown]
[im 1/2]
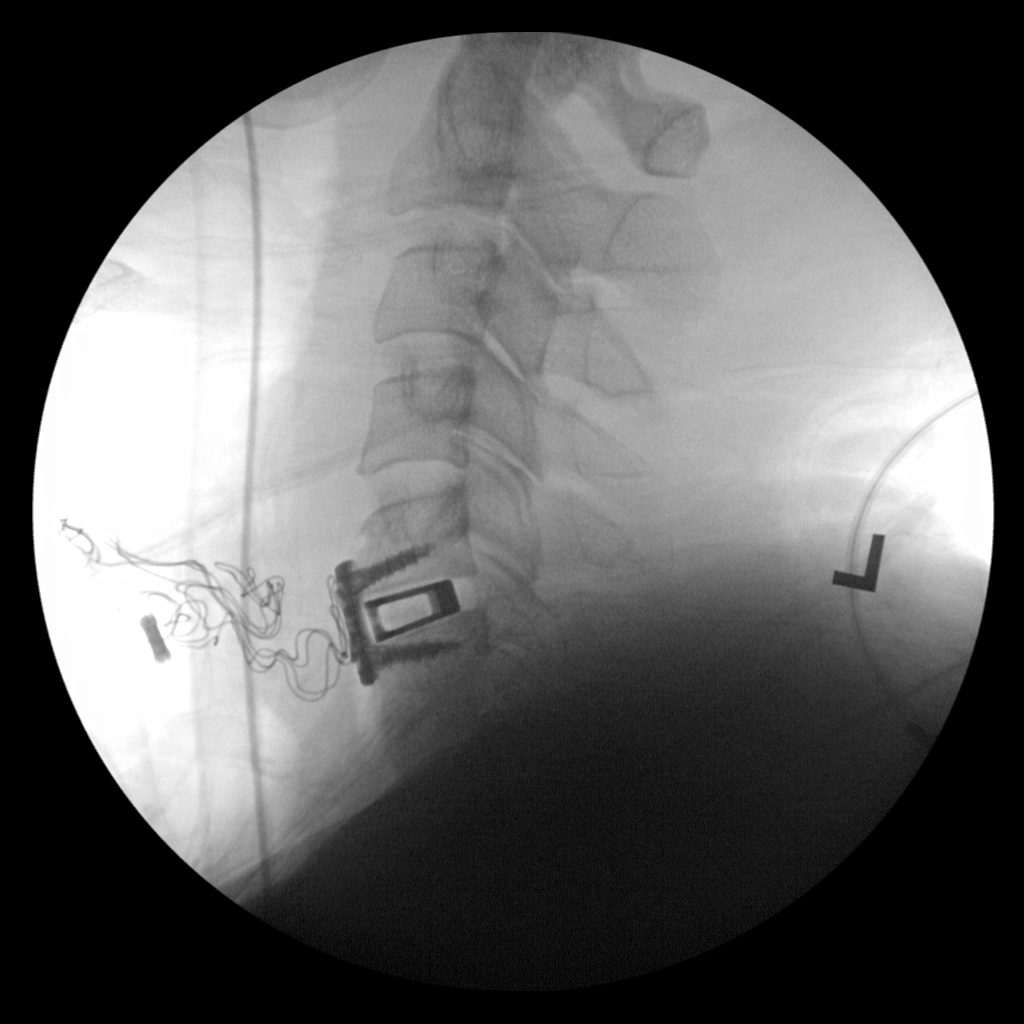
[im 2/2]
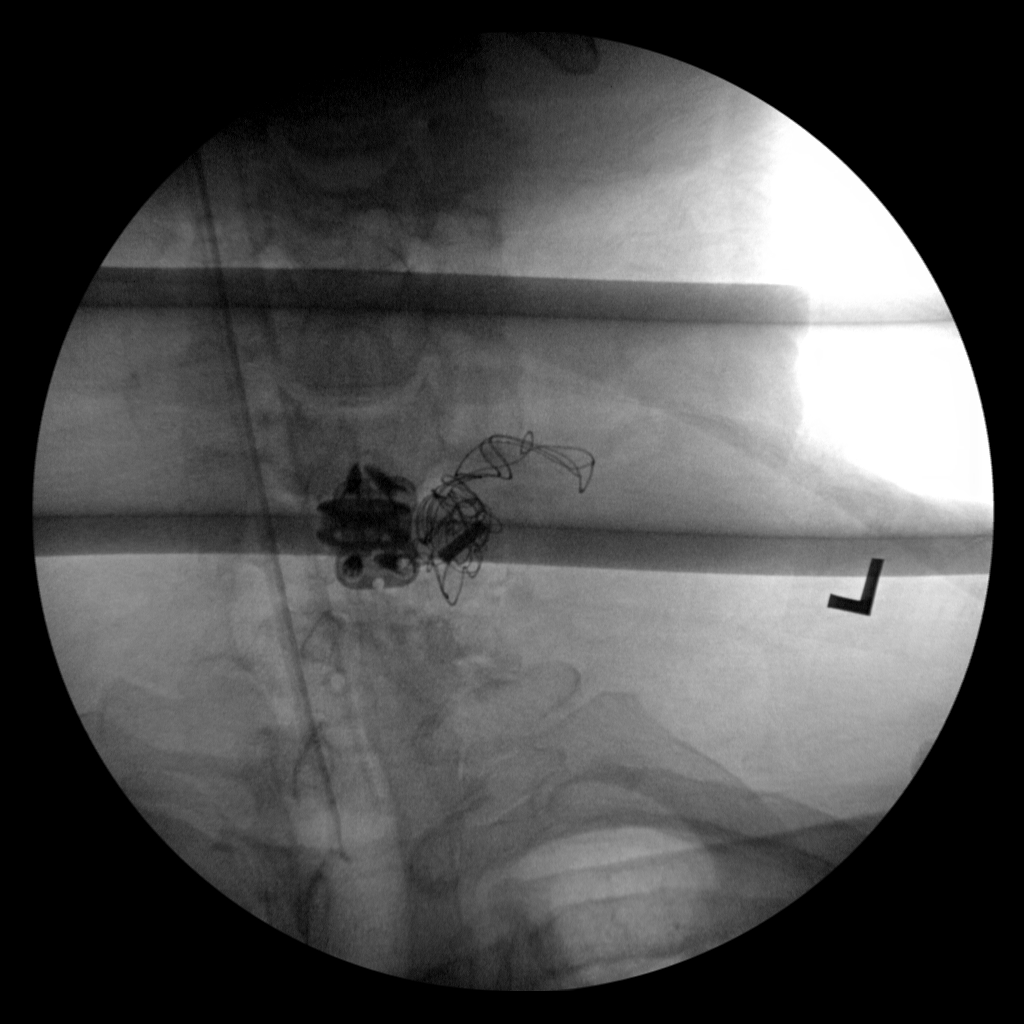

[2 of 2 positions shown; findings below may reference images not displayed]

FINDINGS: PA and lateral view intraoperative fluoroscopic images of the
cervical spine are submitted, 2 images total. The images demonstrate
new ACDF hardware at the C5-C6 level (ventral plate and screws as
well as interbody device). Previously demonstrated ACDF hardware
spanning the C6-C7 level no longer present. Curvilinear hyperdensity
projects ventral to the cervical spine at the C5-C6 level, likely
reflecting a surgical sponge/packing material. Partially visualized
ET tube.
IMPRESSION: Two intraoperative fluoroscopic images of the cervical spine from
C5-C6 ACDF, as described.

Curvilinear hyperdensity projects ventral to the cervical spine at
the C5-C6 level, likely reflecting a surgical sponge/packing
material. Correlate with the operative history.

## 2022-05-30 DIAGNOSIS — I10 Essential (primary) hypertension: Secondary | ICD-10-CM | POA: Diagnosis not present

## 2022-05-30 DIAGNOSIS — E782 Mixed hyperlipidemia: Secondary | ICD-10-CM | POA: Diagnosis not present

## 2022-05-30 DIAGNOSIS — K219 Gastro-esophageal reflux disease without esophagitis: Secondary | ICD-10-CM | POA: Diagnosis not present

## 2022-06-06 DIAGNOSIS — H6122 Impacted cerumen, left ear: Secondary | ICD-10-CM | POA: Diagnosis not present

## 2022-06-20 ENCOUNTER — Other Ambulatory Visit: Payer: Self-pay

## 2022-06-22 DIAGNOSIS — J449 Chronic obstructive pulmonary disease, unspecified: Secondary | ICD-10-CM | POA: Diagnosis not present

## 2022-06-22 DIAGNOSIS — I7772 Dissection of iliac artery: Secondary | ICD-10-CM | POA: Diagnosis not present

## 2022-06-22 DIAGNOSIS — E782 Mixed hyperlipidemia: Secondary | ICD-10-CM | POA: Diagnosis not present

## 2022-06-22 DIAGNOSIS — R0609 Other forms of dyspnea: Secondary | ICD-10-CM | POA: Diagnosis not present

## 2022-06-26 NOTE — Progress Notes (Unsigned)
Cardiology Office Note:    Date:  06/27/2022   ID:  Jimmy Morrison, DOB July 04, 1965, MRN [id number]  PCP:  Lise Auer, MD  Cardiologist:  Norman Herrlich, MD    Referring MD: Lise Auer, MD    ASSESSMENT:    1. Coronary artery disease involving native coronary artery of native heart with other form of angina pectoris (HCC)   2. Primary hypertension   3. Mixed hyperlipidemia   4. Chronic obstructive pulmonary disease, unspecified COPD type (HCC)    PLAN:    In order of problems listed above:  He has known CAD is having nonexertional chest pain further evaluation myocardial perfusion study in the office. With prominent complaint of fatigue I will check full labs including CBC CMP and TSH today Hypertension continue treatment including his ARB. Continue his high intensity statin and icosapent ethyl Stable COPD   Next appointment: 1 month   Medication Adjustments/Labs and Tests Ordered: Current medicines are reviewed at length with the patient today.  Concerns regarding medicines are outlined above.  No orders of the defined types were placed in this encounter.  No orders of the defined types were placed in this encounter.   Chief Complaint  Patient presents with   Follow-up   Coronary Artery Disease    History of Present Illness:    Jimmy Morrison is a 57 y.o. male with a hx of CAD hypertension with mild LVH hyperlipidemia COPD tobacco use PAD with claudication and bipolar disorder last seen 08/29/2021.  He has been seen by vascular surgery [facility] for bilateral iliac artery dissection.  He was not advised surgical intervention.  He had left heart catheterization 12/24/2020 showing moderate multivessel CAD with 50% stenosis in first diagonal branch and otherwise normal-appearing LAD and nonobstructive stenoses in the left circumflex coronary artery with normal left ventricular systolic function advised medical treatment.  He recently had  a BNP level 06/22/2022.was 10 excluding congestive heart failure  Compliance with diet, lifestyle and medications: Yes  He tells me he feels badly he is fatigued all the time and he is a pattern of nearly continuous left precordial pain not severe and nagging waxes and wanes worse with activity but not relieved with rest.  Not pleuritic in nature but he finds himself short of breath.  He tells me he had a chest x-ray done in his PCP office to evaluate this.  Other labs include CMP last July which was normal. 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 ICD-10 cut over Formatting of this note might be different from the original. 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   Obesity (BMI 30.0-34.9) 01/22/2012   OSA (obstructive sleep apnea)  Pain syndrome, chronic 02/22/2012   Postlaminectomy syndrome, lumbar region 01/07/2009   Radiculopathy of cervical spine 05/12/2020   Right arm pain 04/22/2020   S/P cervical spinal fusion 05/28/2020   Tobacco use 12/24/2020   Ulnar neuropathy at elbow of right upper extremity 10/26/2020    Past Surgical History:  Procedure Laterality Date   ANTERIOR CERVICAL DECOMP/DISCECTOMY FUSION N/A  05/12/2020   Procedure: Anterior Cervical Decompression Fusion - Cervical five-Cervical six, removal of Cervical six-seven plate;  Surgeon: Bedelia Person, MD;  Location: [facility] OR;  Service: Neurosurgery;  Laterality: N/A;   ANTERIOR FUSION CERVICAL SPINE     BACK SURGERY     CARPAL TUNNEL RELEASE Right    COLONOSCOPY W/ POLYPECTOMY     LEFT HEART CATH AND CORONARY ANGIOGRAPHY N/A 12/24/2020   Procedure: LEFT HEART CATH AND CORONARY ANGIOGRAPHY;  Surgeon: Lennette Bihari, MD;  Location: [facility] INVASIVE CV LAB;  Service: Cardiovascular;  Laterality: N/A;   LUMBAR NERVE STIMLATOR INSERTION  10/2010   And Cervical nerve stimulator   SHOULDER ARTHROSCOPY Left    cleaned up after accident    Current Medications: Current Meds  Medication Sig   acetaminophen (TYLENOL) 650 MG CR tablet Take 1,300 mg by mouth every 8 (eight) hours as needed for pain.   albuterol (VENTOLIN HFA) 108 (90 Base) MCG/ACT inhaler Inhale 2 puffs into the lungs 2 (two) times daily.   amLODipine (NORVASC) 2.5 MG tablet Take 2.5 mg by mouth daily.   aspirin EC 81 MG tablet Take 81 mg by mouth at bedtime. Swallow whole.   busPIRone (BUSPAR) 5 MG tablet Take 5 mg by mouth 3 (three) times daily as needed (anxiety).   cyclobenzaprine (FLEXERIL) 10 MG tablet Take 10 mg by mouth as needed for muscle spasms.   DULoxetine (CYMBALTA) 60 MG capsule Take 60 mg by mouth every morning.   ezetimibe (ZETIA) 10 MG tablet Take 10 mg by mouth at bedtime.   fenofibrate (TRICOR) 145 MG tablet Take 145 mg by mouth at bedtime.   fluticasone (FLONASE) 50 MCG/ACT nasal spray Place 1-2 sprays into both nostrils daily as needed for allergies or rhinitis.   ipratropium-albuterol (DUONEB) 0.5-2.5 (3) MG/3ML SOLN Inhale 3 mLs into the lungs daily.   magnesium oxide (MAG-OX) 400 MG tablet Take 400 mg by mouth daily.   montelukast (SINGULAIR) 10 MG tablet Take 10 mg by mouth every morning.   naloxone (NARCAN) nasal spray 4 mg/0.1 mL Place 1 spray into the  nose Once PRN.   nitroGLYCERIN (NITROSTAT) 0.4 MG SL tablet Place 1 tablet (0.4 mg total) under the tongue every 5 (five) minutes x 3 doses as needed for chest pain.   omeprazole (PRILOSEC) 40 MG capsule Take 40 mg by mouth 2 (two) times daily.   ondansetron (ZOFRAN-ODT) 4 MG disintegrating tablet Take 4 mg by mouth every 6 (six) hours as needed for nausea.   oxyCODONE (OXY IR/ROXICODONE) 5 MG immediate release tablet Take 5 mg by mouth every 6 (six) hours as needed for moderate pain or severe pain.   rosuvastatin (CRESTOR) 40 MG tablet Take 1 tablet (40 mg total) by mouth daily at 6 PM.   SPIRIVA RESPIMAT 1.25 MCG/ACT AERS Inhale 2 puffs into the lungs daily.   tiZANidine (ZANAFLEX) 2 MG tablet Take 2 mg by mouth at bedtime.   traZODone (DESYREL) 100 MG tablet Take 100-200 mg by mouth at bedtime.   valsartan (DIOVAN) 80 MG tablet Take 80 mg by mouth every morning.   VASCEPA  1 g capsule Take 2 g by mouth 2 (two) times daily.   vitamin B-12 (CYANOCOBALAMIN) 1000 MCG tablet Take 1,000 mcg by mouth daily.     Allergies:   Imdur [isosorbide nitrate], Pregabalin, Aspirin, Bupropion, Gabapentin, Isosorbide, and Prednisone   Social History   Socioeconomic History   Marital status: Married    Spouse name: Not on file   Number of children: Not on file   Years of education: Not on file   Highest education level: Not on file  Occupational History   Not on file  Tobacco Use   Smoking status: Every Day    Packs/day: 1.00    Years: 35.00    Additional pack years: 0.00    Total pack years: 35.00    Types: Cigarettes   Smokeless tobacco: Never  Vaping Use   Vaping Use: Never used  Substance and Sexual Activity   Alcohol use: Not Currently   Drug use: Not Currently   Sexual activity: Not on file  Other Topics Concern   Not on file  Social History Narrative   Not on file   Social Determinants of Health   Financial Resource Strain: Not on file  Food Insecurity: Food Insecurity Present  (03/04/2020)   Hunger Vital Sign    Worried About Running Out of Food in the Last Year: Sometimes true    Ran Out of Food in the Last Year: Sometimes true  Transportation Needs: No Transportation Needs (03/04/2020)   PRAPARE - Administrator, Civil Service (Medical): No    Lack of Transportation (Non-Medical): No  Physical Activity: Not on file  Stress: Not on file  Social Connections: Not on file     Family History: The patient's family history includes Heart attack in his maternal aunt; Hypertension in his brother, mother, and sister. ROS:   Please see the history of present illness.    All other systems reviewed and are negative.  EKGs/Labs/Other Studies Reviewed:    The following studies were reviewed today:  Cardiac Studies & Procedures   CARDIAC CATHETERIZATION  CARDIAC CATHETERIZATION 12/24/2020  Narrative   1st Diag lesion is 50% stenosed.   Prox RCA to Mid RCA lesion is 100% stenosed.   Prox Cx lesion is 20% stenosed.   Mid Cx lesion is 20% stenosed.  Moderate multivessel CAD with with 50% stenosis in the first diagonal branch of the LAD with otherwise normal-appearing LAD; nonobstructive 20% stenoses in the circumflex vessel; and chronic appearing total occlusion of the proximal RCA with antegrade bridging collateralization as well as retrograde left to right collateralization to the distal vessel.  Normal LV contractility with EF estimated 50 to 55% without definitive segmental wall motion abnormalities.  LVEDP 10 mmHg.  RECOMMENDATION: Medical therapy.  Patient was br[facility]cardic at [facility].  Consider adding isosorbide and/or amlodipine for anti-ischemic benefit.  Aggressive lipid-lowering therapy with target LDL less than 70.  Findings Coronary Findings Diagnostic  Dominance: Right  Left Anterior Descending  First Diagonal Branch 1st Diag lesion is 50% stenosed.  Left Circumflex Prox Cx lesion is 20% stenosed. Mid Cx lesion is 20%  stenosed.  Right Coronary Artery Prox RCA to Mid RCA lesion is 100% stenosed.  Acute Marginal Branch Collaterals Acute Mrg filled by collaterals from RV Branch.  Third Right Posterolateral Branch Collaterals 3rd RPL filled by collaterals from 1st Sept.  Intervention  No interventions have been documented.   STRESS TESTS  MYOCARDIAL PERFUSION IMAGING 01/26/2021  EKG:  EKG ordered today and personally reviewed.  The ekg ordered today demonstrates sinus rhythm and is normal  Recent Labs: 08/29/2021: ALT 9; BUN 8; Creatinine, Ser 1.14; Potassium 3.8; Sodium 141  Recent Lipid Panel    Component Value Date/Time   CHOL 99 (L) 08/29/2021 1428   TRIG 116 08/29/2021 1428   HDL 39 (L) 08/29/2021 1428   CHOLHDL 2.5 08/29/2021 1428   CHOLHDL 4.0 12/24/2020 0128   VLDL 51 (H) 12/24/2020 0128   LDLCALC 39 08/29/2021 1428    Physical Exam:    VS:  BP 130/68   Pulse 70   Ht 5\' 9"  (1.753 m)   Wt 220 lb 12.8 oz (100.2 kg)   SpO2 93%   BMI 32.61 kg/m     Wt Readings from Last 3 Encounters:  06/27/22 220 lb 12.8 oz (100.2 kg)  08/29/21 211 lb (95.7 kg)  03/01/21 221 lb (100.2 kg)     GEN:  Well nourished, well developed in no acute distress HEENT: Normal NECK: No JVD; No carotid bruits LYMPHATICS: No lymphadenopathy CARDIAC: RRR, no murmurs, rubs, gallops RESPIRATORY:  Clear to auscultation without rales, wheezing or rhonchi  ABDOMEN: Soft, non-tender, non-distended MUSCULOSKELETAL:  No edema; No deformity  SKIN: Warm and dry NEUROLOGIC:  Alert and oriented x 3 PSYCHIATRIC:  Normal affect    Signed, Norman Herrlich, MD  06/27/2022 2:39 PM    [facility]

## 2022-06-27 ENCOUNTER — Encounter: Payer: Self-pay | Admitting: Cardiology

## 2022-06-27 ENCOUNTER — Ambulatory Visit: Payer: 59 | Attending: Cardiology | Admitting: Cardiology

## 2022-06-27 VITALS — BP 130/68 | HR 70 | Ht 69.0 in | Wt 220.8 lb

## 2022-06-27 DIAGNOSIS — J449 Chronic obstructive pulmonary disease, unspecified: Secondary | ICD-10-CM

## 2022-06-27 DIAGNOSIS — I10 Essential (primary) hypertension: Secondary | ICD-10-CM

## 2022-06-27 DIAGNOSIS — E782 Mixed hyperlipidemia: Secondary | ICD-10-CM | POA: Diagnosis not present

## 2022-06-27 DIAGNOSIS — I25118 Atherosclerotic heart disease of native coronary artery with other forms of angina pectoris: Secondary | ICD-10-CM | POA: Diagnosis not present

## 2022-06-27 NOTE — Addendum Note (Signed)
Addended by: Roxanne Mins I on: 06/27/2022 03:56 PM   Modules accepted: Orders

## 2022-06-27 NOTE — Addendum Note (Signed)
Addended by: Roxanne Mins I on: 06/27/2022 03:04 PM   Modules accepted: Orders

## 2022-06-27 NOTE — Patient Instructions (Signed)
Medication Instructions:  Your physician recommends that you continue on your current medications as directed. Please refer to the Current Medication list given to you today.  *If you need a refill on your cardiac medications before your next appointment, please call your pharmacy*   Lab Work: Your physician recommends that you return for lab work in:   Labs today: CBC, CMP, Lipids, TSH  If you have labs (blood work) drawn today and your tests are completely normal, you will receive your results only by: MyChart Message (if you have MyChart) OR A paper copy in the mail If you have any lab test that is abnormal or we need to change your treatment, we will call you to review the results.   Testing/Procedures:   [facility] [address] [place], [place] [address] Phone:  [phone]    Please arrive 15 minutes prior to your appointment time for registration and insurance purposes.  The test will take approximately 3 to 4 hours to complete; you may bring reading material.  If someone comes with you to your appointment, they will need to remain in the main lobby due to limited space in the testing area. **If you are pregnant or breastfeeding, please notify the nuclear lab prior to your appointment**  How to prepare for your Myocardial Perfusion Test: Do not eat or drink 3 hours prior to your test, except you may have water. Do not consume products containing caffeine (regular or decaffeinated) 12 hours prior to your test. (ex: coffee, chocolate, sodas, tea). Do bring a list of your current medications with you.  If not listed below, you may take your medications as normal. Do wear comfortable clothes (no dresses or overalls) and walking shoes, tennis shoes preferred (No heels or open toe shoes are allowed). Do NOT wear cologne, perfume, aftershave, or lotions (deodorant is allowed). If these instructions are not followed, your test will have to be  rescheduled.  Please report to [address] for your test.  If you have questions or concerns about your appointment, you can call the [facility] at [phone].  If you cannot keep your appointment, please provide 24 hours notification to the Nuclear Lab, to avoid a possible $50 charge to your account.    Follow-Up: At [facility], you and your health needs are our priority.  As part of our continuing mission to provide you with exceptional heart care, we have created designated Provider Care Teams.  These Care Teams include your primary Cardiologist (physician) and Advanced Practice Providers (APPs -  Physician Assistants and Nurse Practitioners) who all work together to provide you with the care you need, when you need it.  We recommend signing up for the patient portal called "MyChart".  Sign up information is provided on this After Visit Summary.  MyChart is used to connect with patients for Virtual Visits (Telemedicine).  Patients are able to view lab/test results, encounter notes, upcoming appointments, etc.  Non-urgent messages can be sent to your provider as well.   To learn more about what you can do with MyChart, go to [email or web address].    Your next appointment:   1 month(s)  Provider:   Norman Herrlich, MD    Other Instructions None

## 2022-06-27 NOTE — Addendum Note (Signed)
Addended by: Norman Herrlich on: 06/27/2022 07:38 PM   Modules accepted: Orders

## 2022-06-28 LAB — COMPREHENSIVE METABOLIC PANEL
ALT: 12 IU/L (ref 0–44)
AST: 14 IU/L (ref 0–40)
Albumin/Globulin Ratio: 1.7 (ref 1.2–2.2)
Albumin: 4.3 g/dL (ref 3.8–4.9)
Alkaline Phosphatase: 54 IU/L (ref 44–121)
BUN/Creatinine Ratio: 5 — ABNORMAL LOW (ref 9–20)
BUN: 6 mg/dL (ref 6–24)
Bilirubin Total: 0.5 mg/dL (ref 0.0–1.2)
CO2: 18 mmol/L — ABNORMAL LOW (ref 20–29)
Calcium: 9.8 mg/dL (ref 8.7–10.2)
Chloride: 103 mmol/L (ref 96–106)
Creatinine, Ser: 1.24 mg/dL (ref 0.76–1.27)
Globulin, Total: 2.6 g/dL (ref 1.5–4.5)
Glucose: 90 mg/dL (ref 70–99)
Potassium: 4.2 mmol/L (ref 3.5–5.2)
Sodium: 136 mmol/L (ref 134–144)
Total Protein: 6.9 g/dL (ref 6.0–8.5)
eGFR: 68 mL/min/{1.73_m2} (ref >59)

## 2022-06-28 LAB — LIPID PANEL
Chol/HDL Ratio: 2.3 ratio (ref 0.0–5.0)
Cholesterol, Total: 98 mg/dL — ABNORMAL LOW (ref 100–199)
HDL: 42 mg/dL (ref >39)
LDL Chol Calc (NIH): 34 mg/dL (ref 0–99)
Triglycerides: 123 mg/dL (ref 0–149)
VLDL Cholesterol Cal: 22 mg/dL (ref 5–40)

## 2022-06-28 LAB — CBC
Hematocrit: 48.1 % (ref 37.5–51.0)
Hemoglobin: 16.9 g/dL (ref 13.0–17.7)
MCH: 32.4 pg (ref 26.6–33.0)
MCHC: 35.1 g/dL (ref 31.5–35.7)
MCV: 92 fL (ref 79–97)
Platelets: 205 10*3/uL (ref 150–450)
RBC: 5.22 x10E6/uL (ref 4.14–5.80)
RDW: 11.9 % (ref 11.6–15.4)
WBC: 7.8 10*3/uL (ref 3.4–10.8)

## 2022-06-28 LAB — TSH: TSH: 1.33 u[IU]/mL (ref 0.450–4.500)

## 2022-06-30 DIAGNOSIS — K219 Gastro-esophageal reflux disease without esophagitis: Secondary | ICD-10-CM | POA: Diagnosis not present

## 2022-06-30 DIAGNOSIS — E782 Mixed hyperlipidemia: Secondary | ICD-10-CM | POA: Diagnosis not present

## 2022-06-30 DIAGNOSIS — I10 Essential (primary) hypertension: Secondary | ICD-10-CM | POA: Diagnosis not present

## 2022-07-05 ENCOUNTER — Telehealth (HOSPITAL_COMMUNITY): Payer: Self-pay | Admitting: *Deleted

## 2022-07-05 NOTE — Telephone Encounter (Signed)
Left detailed instructions for MPI study. 

## 2022-07-10 DIAGNOSIS — I772 Rupture of artery: Secondary | ICD-10-CM | POA: Diagnosis not present

## 2022-07-10 DIAGNOSIS — I771 Stricture of artery: Secondary | ICD-10-CM | POA: Diagnosis not present

## 2022-07-11 ENCOUNTER — Ambulatory Visit: Payer: 59 | Attending: Cardiology

## 2022-07-11 DIAGNOSIS — I10 Essential (primary) hypertension: Secondary | ICD-10-CM | POA: Diagnosis not present

## 2022-07-11 DIAGNOSIS — I25118 Atherosclerotic heart disease of native coronary artery with other forms of angina pectoris: Secondary | ICD-10-CM

## 2022-07-11 DIAGNOSIS — E782 Mixed hyperlipidemia: Secondary | ICD-10-CM | POA: Diagnosis not present

## 2022-07-11 DIAGNOSIS — J449 Chronic obstructive pulmonary disease, unspecified: Secondary | ICD-10-CM | POA: Diagnosis not present

## 2022-07-11 LAB — MYOCARDIAL PERFUSION IMAGING
LV dias vol: 107 mL (ref 62–150)
LV sys vol: 42 mL
Nuc Stress EF: 61 %
Peak HR: 68 {beats}/min
Rest HR: 56 {beats}/min
Rest Nuclear Isotope Dose: 10.4 mCi
SDS: 5
SRS: 3
SSS: 8
ST Depression (mm): 0 mm
Stress Nuclear Isotope Dose: 31.4 mCi
TID: 0.98

## 2022-07-11 MED ORDER — TECHNETIUM TC 99M TETROFOSMIN IV KIT
10.4000 | PACK | Freq: Once | INTRAVENOUS | Status: AC | PRN
  Administered 2022-07-11: 10.4 via INTRAVENOUS

## 2022-07-11 MED ORDER — TECHNETIUM TC 99M TETROFOSMIN IV KIT
31.4000 | PACK | Freq: Once | INTRAVENOUS | Status: AC | PRN
  Administered 2022-07-11: 31.4 via INTRAVENOUS

## 2022-07-11 MED ORDER — REGADENOSON 0.4 MG/5ML IV SOLN
0.4000 mg | Freq: Once | INTRAVENOUS | Status: AC
Start: 2022-07-11 — End: 2022-07-11
  Administered 2022-07-11: 0.4 mg via INTRAVENOUS

## 2022-07-18 ENCOUNTER — Encounter: Payer: Self-pay | Admitting: Family Medicine

## 2022-07-27 NOTE — Progress Notes (Signed)
Cardiology Office Note:  .   Date:  07/28/2022  ID:  Jimmy Morrison, DOB 03-25-1965, MRN [id number] PCP: Lise Auer, MD  [facility] Providers Cardiologist:  Norman Herrlich, MD    History of Present Illness: .   Jimmy Morrison is a 57 y.o. male with a past medical history of CAD, hypertension, COPD, OSA, GERD, hyperlipidemia, tobacco use.  12/24/20 LHC non-obstructive CAD 12/25/20  echocardiogram EF 55-60%, impaired relaxation, trace MR, mild TR 07/11/22 MPI Findings are consistent with mild ischemia involving apical and mid portion of the inferior walland no infarction. The study is intermediate risk   Most recently evaluated by Dr. Dulce Sellar on 06/27/2022 with complaints of nonexertional chest pain. MPI was arranged and revealed mild ischemia, intermediate risk.   He presents today feeling no worse, feeling better than he previously did.  His biggest complaint is fatigue.  He does have some episodes of chest pain however they are atypical for angina, can occur with rest or with exertion, describes it as a cramping sensation.  He also has noticed his abdomen is swelling, he feels short of breath and is having to sleep on the couch so he can prop his head up at night. He denies palpitations, dyspnea, pnd, orthopnea, n, v, dizziness, syncope, or early satiety.     ROS: Review of Systems  Constitutional:  Positive for malaise/fatigue.  HENT: Negative.    Eyes:  Positive for blurred vision.  Respiratory:  Positive for cough and shortness of breath.   Cardiovascular:  Positive for chest pain and orthopnea. Negative for PND.  Gastrointestinal: Negative.   Genitourinary: Negative.   Musculoskeletal: Negative.   Skin: Negative.   Neurological: Negative.   Endo/Heme/Allergies: Negative.   Psychiatric/Behavioral: Negative.       Studies Reviewed: .        Cardiac Studies & Procedures   CARDIAC CATHETERIZATION  CARDIAC CATHETERIZATION 12/24/2020  Narrative   1st Diag lesion  is 50% stenosed.   Prox RCA to Mid RCA lesion is 100% stenosed.   Prox Cx lesion is 20% stenosed.   Mid Cx lesion is 20% stenosed.  Moderate multivessel CAD with with 50% stenosis in the first diagonal branch of the LAD with otherwise normal-appearing LAD; nonobstructive 20% stenoses in the circumflex vessel; and chronic appearing total occlusion of the proximal RCA with antegrade bridging collateralization as well as retrograde left to right collateralization to the distal vessel.  Normal LV contractility with EF estimated 50 to 55% without definitive segmental wall motion abnormalities.  LVEDP 10 mmHg.  RECOMMENDATION: Medical therapy.  Patient was bradycardic at [facility].  Consider adding isosorbide and/or amlodipine for anti-ischemic benefit.  Aggressive lipid-lowering therapy with target LDL less than 70.  Findings Coronary Findings Diagnostic  Dominance: Right  Left Anterior Descending  First Diagonal Branch 1st Diag lesion is 50% stenosed.  Left Circumflex Prox Cx lesion is 20% stenosed. Mid Cx lesion is 20% stenosed.  Right Coronary Artery Prox RCA to Mid RCA lesion is 100% stenosed.  Acute Marginal Branch Collaterals Acute Mrg filled by collaterals from RV Branch.  Third Right Posterolateral Branch Collaterals 3rd RPL filled by collaterals from 1st Sept.  Intervention  No interventions have been documented.   STRESS TESTS  MYOCARDIAL PERFUSION IMAGING 07/11/2022  Narrative   Findings are consistent with mild ischemia involving apical and mid portion of the inferior walland no infarction. The study is intermediate risk.   No ST deviation was noted.   Left ventricular function  is normal. Nuclear stress EF: 61 %. The left ventricular ejection fraction is normal (55-65%). End diastolic cavity size is normal.   Prior study available for comparison from 12/22/2020.              Risk Assessment/Calculations:             Physical Exam:   VS:  BP 118/80  (BP Location: Left Arm, Patient Position: Sitting, Cuff Size: Normal)   Pulse 68   Ht 5\' 9"  (1.753 m)   Wt 217 lb 3.2 oz (98.5 kg)   SpO2 96%   BMI 32.07 kg/m    Wt Readings from Last 3 Encounters:  07/28/22 217 lb 3.2 oz (98.5 kg)  07/11/22 220 lb (99.8 kg)  06/27/22 220 lb 12.8 oz (100.2 kg)    GEN: Well nourished, well developed in no acute distress NECK: No JVD; No carotid bruits CARDIAC: RRR, no murmurs, rubs, gallops RESPIRATORY: Trace Rales appreciated bilaterally ABDOMEN: abdomen distended and firm.  EXTREMITIES:  No edema; No deformity   ASSESSMENT AND PLAN: .   CAD-left heart cath in 2022 showing moderate multivessel disease with 50% stenosis in first diagonal branch with recommendations for medical therapy.  Recent MPI with intermediate risk.  Continue aspirin 81 mg daily, continue Vascepa 1 g daily, continue fenofibrate 145 mg daily, continue Zetia 10 mg daily.  Will start Ranexa 500 mg twice daily.  HFpEF - most recent echocardiogram EF 55-60%, impaired relaxation, trace MR, mild TR. Trace rales and abdomen swelling. Will give torsemide 10 mg PO x 3 days then PRN for abdomen swelling. Repeat BMET in 1 week after he returns from vacation. Most recent Cr 1.24.  Continue valsartan 80 mg daily.  Hypertension-blood pressure is well-controlled today at 118/80 continue Norvasc 2.5 mg daily, continue valsartan 80 mg daily.  HLD - LDL is well controlled at 34, continue Zetia 10 mg daily, fenofibrate 145 mg daily, Vascepa 1 g daily.        Dispo: start Ranexa 500 mg twice daily, torsemide per above, repeat BMET in 1 week.   Signed, Flossie Dibble, NP

## 2022-07-28 ENCOUNTER — Ambulatory Visit: Payer: 59 | Attending: Cardiology | Admitting: Cardiology

## 2022-07-28 ENCOUNTER — Encounter: Payer: Self-pay | Admitting: Cardiology

## 2022-07-28 VITALS — BP 118/80 | HR 68 | Ht 69.0 in | Wt 217.2 lb

## 2022-07-28 DIAGNOSIS — I25118 Atherosclerotic heart disease of native coronary artery with other forms of angina pectoris: Secondary | ICD-10-CM | POA: Diagnosis not present

## 2022-07-28 DIAGNOSIS — I5030 Unspecified diastolic (congestive) heart failure: Secondary | ICD-10-CM

## 2022-07-28 DIAGNOSIS — I10 Essential (primary) hypertension: Secondary | ICD-10-CM

## 2022-07-28 MED ORDER — TORSEMIDE 10 MG PO TABS: 10.0000 mg | ORAL_TABLET | Freq: Every day | ORAL | 3 refills | Status: DC | PRN

## 2022-07-28 MED ORDER — RANOLAZINE ER 500 MG PO TB12: 500.0000 mg | ORAL_TABLET | Freq: Two times a day (BID) | ORAL | 3 refills | Status: DC

## 2022-07-28 NOTE — Patient Instructions (Addendum)
Medication Instructions:  Your physician has recommended you make the following change in your medication:  Take Torsemide 10 mg once daily for 3 days and then take as needed for ankle swelling Start Ranexa 500 mg two times daily  *If you need a refill on your cardiac medications before your next appointment, please call your pharmacy*   Lab Work: Your physician recommends that you return for lab work in: 10 days for a BMP Lab opens at 8am. You DO NOT NEED an appointment. Best time to come is between 8am and 12noon and between 1:30 and 4:30. If you have been asked to fast for your blood work please have nothing to eat or drink after midnight. You may have water.   If you have labs (blood work) drawn today and your tests are completely normal, you will receive your results only by: MyChart Message (if you have MyChart) OR A paper copy in the mail If you have any lab test that is abnormal or we need to change your treatment, we will call you to review the results.   Testing/Procedures: NONE   Follow-Up: At [facility], you and your health needs are our priority.  As part of our continuing mission to provide you with exceptional heart care, we have created designated Provider Care Teams.  These Care Teams include your primary Cardiologist (physician) and Advanced Practice Providers (APPs -  Physician Assistants and Nurse Practitioners) who all work together to provide you with the care you need, when you need it.  We recommend signing up for the patient portal called "MyChart".  Sign up information is provided on this After Visit Summary.  MyChart is used to connect with patients for Virtual Visits (Telemedicine).  Patients are able to view lab/test results, encounter notes, upcoming appointments, etc.  Non-urgent messages can be sent to your provider as well.   To learn more about what you can do with MyChart, go to [email or web address].    Your next appointment:   3  week(s)  Provider:   Norman Herrlich, MD    Other Instructions  Ranolazine Extended-Release Tablets What is this medication? RANOLAZINE (ra NOE la zeen) treats chronic chest pain (angina). It works by reducing how often you get chest pain and improves your ability to exercise. Do not use it to treat sudden chest pain. This medicine may be used for other purposes; ask your health care provider or pharmacist if you have questions. COMMON BRAND NAME(S): Ranexa What should I tell my care team before I take this medication? They need to know if you have any of these conditions: Heart disease Irregular heartbeat or rhythm Kidney disease Liver disease Low levels of potassium or magnesium in the blood An unusual or allergic reaction to ranolazine, other medications, foods, dyes, or preservatives Pregnant or trying to get pregnant Breast-feeding How should I use this medication? Take this medication by mouth with water. Take it as directed on the prescription label at the same time every day. Do not cut, crush, or chew this medication. Swallow the tablets whole. You may take it with or without food. If it upsets your stomach, take it with food. Do not take this medication with grapefruit juice. Keep taking it unless your care team tells you to stop. Talk to your care team about the use of this medication in children. Special care may be needed. Overdosage: If you think you have taken too much of this medicine contact a poison control center or emergency room at  once. NOTE: This medicine is only for you. Do not share this medicine with others. What if I miss a dose? If you miss a dose, take it as soon as you can. If it is almost time for your next dose, take only that dose. Do not take double or extra doses. What may interact with this medication? Do not take this medication with any of the following: Adagrasib Cerivastatin Certain antibiotics, such as clarithromycin, rifabutin, rifampin,  rifapentine Certain antivirals for hepatitis or HIV Certain medications for fungal infections, such as itraconazole, ketoconazole, posaconazole, voriconazole Certain medications for irregular heartbeat, such as dronedarone Certain medications for seizures, such as carbamazepine, fosphenytoin, oxcarbazepine, phenobarbital, phenytoin Certain medications used for cancer treatment Cisapride Conivaptan Nefazodone Pimozide St. John's wort Thioridazine This medication may also interact with the following: Certain medications for cholesterol, such as atorvastatin, lovastatin, simvastatin Certain medications for depression, anxiety, or mental health conditions Cyclosporine Digoxin Diltiazem Dofetilide Eplerenone Ergot alkaloids, such as dihydroergotamine, ergonovine, ergotamine, methylergonovine Erythromycin Fluconazole Grapefruit or grapefruit juice Metformin Other medications that cause heart rhythm changes Sirolimus Tacrolimus Verapamil This list may not describe all possible interactions. Give your health care provider a list of all the medicines, herbs, non-prescription drugs, or dietary supplements you use. Also tell them if you smoke, drink alcohol, or use illegal drugs. Some items may interact with your medicine. What should I watch for while using this medication? Visit your care team for regular checks on your progress. Tell your care team if your symptoms do not start to get better or if they get worse. This medication will not relieve an acute attack of angina or chest pain. This medication may affect your coordination, reaction time, or judgment. Do not drive or operate machinery until you know how this medication affects you. Sit up or stand slowly to reduce the risk of dizzy or fainting spells. Drinking alcohol with this medication can increase the risk of these side effects. If you are going to need surgery or other procedure, tell your care team that you are using this  medication. What side effects may I notice from receiving this medication? Side effects that you should report to your care team as soon as possible: Allergic reactions--skin rash, itching, hives, swelling of the face, lips, tongue, or throat Heart rhythm changes--fast or irregular heartbeat, dizziness, feeling faint or lightheaded, chest pain, trouble breathing Side effects that usually do not require medical attention (report to your care team if they continue or are bothersome): Constipation Dizziness Headache Nausea This list may not describe all possible side effects. Call your doctor for medical advice about side effects. You may report side effects to FDA at 1-800-FDA-1088. Where should I keep my medication? Keep out of the reach of children and pets. Store at room temperature at 25 degrees C (77 degrees F). Get rid of any unused medication after the expiration date. To get rid of medications that are no longer needed or have expired: Take the medication to a medication take-back program. Check with your pharmacy or law enforcement to find a location. If you cannot return the medication, check the label or package insert to see if the medication should be thrown out in the garbage or flushed down the toilet. If you are not sure, ask your care team. If it is safe to put it in the trash, empty the medication out of the container. Mix the medication with cat litter, dirt, coffee grounds, or other unwanted substance. Seal the mixture in a bag or  container. Put it in the trash. NOTE: This sheet is a summary. It may not cover all possible information. If you have questions about this medicine, talk to your doctor, pharmacist, or health care provider.  2024 Elsevier/Gold Standard (2021-02-25 00:00:00)

## 2022-08-20 NOTE — Progress Notes (Signed)
Cardiology Office Note:    Date:  08/21/2022   ID:  Jimmy Morrison, DOB 1965-12-19, MRN [id number]  PCP:  Lise Auer, MD  Cardiologist:  Norman Herrlich, MD    Referring MD: Lise Auer, MD    ASSESSMENT:    1. Coronary artery disease involving native coronary artery of native heart with other form of angina pectoris (HCC)   2. Primary hypertension   3. Mixed hyperlipidemia    PLAN:    In order of problems listed above:  He continues to be symptomatic left heart cath November 2022 showed moderate multivessel CAD with occlusion of the proximal to mid right coronary artery and 50% diagonal stenosis.  I think he benefit from repeat coronary angiography possible percutaneous intervention and perhaps even consideration of total occlusion PCI.  Will continue his current multidrug regimen including antianginals and multiple lipid-lowering medications statin Zetia fenofibrate and icosapent ethyl. Stable hypertension continue his current antihypertensive agents And care continue current intense lipid-lowering treatment his LDL is at target   Next appointment: 3 months   Medication Adjustments/Labs and Tests Ordered: Current medicines are reviewed at length with the patient today.  Concerns regarding medicines are outlined above.  No orders of the defined types were placed in this encounter.  No orders of the defined types were placed in this encounter.    History of Present Illness:    Jimmy Morrison is a 57 y.o. male with a hx of coronary artery disease hypertension hyperlipidemia COPD and obstructive sleep apnea last seen 07/28/2022 by [facility]is [facility]berg nurse practitioner following a myocardial perfusion study showing mild ischemia.  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  He had  exercise intolerance abnormal perfusion study medications were increased adding ranolazine and unfortunately he continues to feel badly He has developed marked exercise intolerance and has exertional angina when walking to his mailbox perhaps 50 foot he is on improved and is unhappy with the quality of his life.  He has not had rest or nocturnal episodes of angina.  He is on a good medical regimen including aspirin calcium channel blocker and ranolazine.  With his ongoing limiting symptoms advised to undergo coronary angiography options risk and benefits detailed.  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 ICD-10 cut over Formatting of this note might be different from the original. 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   albuterol (VENTOLIN HFA) 108 (90 Base) MCG/ACT inhaler Inhale 2 puffs into the lungs 2 (two) times daily.   amLODipine (NORVASC) 2.5 MG tablet Take 2.5 mg by mouth daily.   aspirin EC 81 MG tablet Take 81 mg by mouth at bedtime. Swallow whole.   busPIRone (BUSPAR) 5 MG tablet Take 5 mg by mouth 3 (three) times daily as needed (anxiety).   cyclobenzaprine (FLEXERIL) 10 MG tablet Take 10 mg by mouth as needed for muscle spasms.   DULoxetine (CYMBALTA) 60 MG capsule Take 60 mg by mouth every morning.   ezetimibe (ZETIA) 10 MG tablet Take 10 mg by mouth at bedtime.   fenofibrate (TRICOR) 145 MG tablet Take 145 mg by mouth at bedtime.   fluticasone (FLONASE) 50 MCG/ACT nasal spray Place 1-2 sprays into both nostrils daily as needed for allergies or rhinitis.   ipratropium-albuterol (DUONEB) 0.5-2.5 (3) MG/3ML SOLN Inhale 3 mLs into the lungs daily.   magnesium oxide (MAG-OX) 400 MG tablet Take 400 mg by mouth daily.   montelukast (SINGULAIR) 10 MG tablet Take 10 mg by mouth every morning.   naloxone (NARCAN) nasal spray 4 mg/0.1 mL Place 1 spray into the nose Once PRN.   nitroGLYCERIN (NITROSTAT) 0.4 MG SL tablet Place 1 tablet (0.4 mg total) under the tongue every 5 (five) minutes x 3 doses as needed for chest pain.   omeprazole (PRILOSEC) 40 MG capsule Take 40 mg by mouth 2 (two) times daily.   ondansetron (ZOFRAN-ODT) 4 MG disintegrating tablet Take 4 mg by mouth every 6 (six) hours as needed for nausea.   oxyCODONE (OXY IR/ROXICODONE) 5 MG immediate release tablet Take 5 mg by mouth every 6 (six) hours as needed for moderate pain or severe pain.   ranolazine (RANEXA) 500 MG 12 hr tablet Take 1 tablet (500 mg total) by mouth 2 (two) times daily.   rosuvastatin (CRESTOR) 40 MG tablet Take 1 tablet (40 mg total) by mouth  daily at 6 PM.   SPIRIVA RESPIMAT 1.25 MCG/ACT AERS Inhale 2 puffs into the lungs daily.   STIOLTO RESPIMAT 2.5-2.5 MCG/ACT AERS Inhale 2.5 each into the lungs daily.   tiZANidine (ZANAFLEX) 2 MG tablet Take 2 mg by mouth at bedtime.   torsemide (DEMADEX) 10 MG tablet Take 1 tablet (10 mg total) by mouth daily as needed (Swelling and fluid retention).   traZODone (DESYREL) 100 MG tablet Take 100-200 mg by mouth at bedtime.   valsartan (DIOVAN) 80 MG tablet Take 80 mg by mouth every morning.   VASCEPA 1 g capsule Take 2 g by mouth 2 (two) times daily.   vitamin B-12 (CYANOCOBALAMIN) 1000 MCG tablet Take 1,000 mcg by mouth daily.      EKGs/Labs/Other Studies Reviewed:    The following studies were reviewed today:  Cardiac Studies & Procedures   CARDIAC CATHETERIZATION  CARDIAC CATHETERIZATION 12/24/2020  Narrative   1st Diag lesion is 50% stenosed.   Prox RCA to Mid RCA lesion is 100% stenosed.   Prox Cx lesion is 20% stenosed.   Mid Cx lesion  is 20% stenosed.  Moderate multivessel CAD with with 50% stenosis in the first diagonal branch of the LAD with otherwise normal-appearing LAD; nonobstructive 20% stenoses in the circumflex vessel; and chronic appearing total occlusion of the proximal RCA with antegrade bridging collateralization as well as retrograde left to right collateralization to the distal vessel.  Normal LV contractility with EF estimated 50 to 55% without definitive segmental wall motion abnormalities.  LVEDP 10 mmHg.  RECOMMENDATION: Medical therapy.  Patient was [facility]cardic at [facility].  Consider adding isosorbide and/or amlodipine for anti-ischemic benefit.  Aggressive lipid-lowering therapy with target LDL less than 70.  Findings Coronary Findings Diagnostic  Dominance: Right  Left Anterior Descending  First Diagonal Branch 1st Diag lesion is 50% stenosed.  Left Circumflex Prox Cx lesion is 20% stenosed. Mid Cx lesion is 20% stenosed.  Right  Coronary Artery Prox RCA to Mid RCA lesion is 100% stenosed.  Acute Marginal Branch Collaterals Acute Mrg filled by collaterals from RV Branch.  Third Right Posterolateral Branch Collaterals 3rd RPL filled by collaterals from 1st Sept.  Intervention  No interventions have been documented.   STRESS TESTS  MYOCARDIAL PERFUSION IMAGING 07/11/2022  Narrative   Findings are consistent with mild ischemia involving apical and mid portion of the inferior walland no infarction. The study is intermediate risk.   No ST deviation was noted.   Left ventricular function is normal. Nuclear stress EF: 61 %. The left ventricular ejection fraction is normal (55-65%). End diastolic cavity size is normal.   Prior study available for comparison from 12/22/2020.                  Recent Labs: 06/27/2022: ALT 12; BUN 6; Creatinine, Ser 1.24; Hemoglobin 16.9; Platelets 205; Potassium 4.2; Sodium 136; TSH 1.330  Recent Lipid Panel    Component Value Date/Time   CHOL 98 (L) 06/27/2022 1508   TRIG 123 06/27/2022 1508   HDL 42 06/27/2022 1508   CHOLHDL 2.3 06/27/2022 1508   CHOLHDL 4.0 12/24/2020 0128   VLDL 51 (H) 12/24/2020 0128   LDLCALC 34 06/27/2022 1508    Physical Exam:    VS:  BP 118/62   Pulse 60   Ht 5\' 9"  (1.753 m)   Wt 215 lb 12.8 oz (97.9 kg)   SpO2 93%   BMI 31.87 kg/m     Wt Readings from Last 3 Encounters:  08/21/22 215 lb 12.8 oz (97.9 kg)  07/28/22 217 lb 3.2 oz (98.5 kg)  07/11/22 220 lb (99.8 kg)     GEN:  Well nourished, well developed in no acute distress HEENT: Normal NECK: No JVD; No carotid bruits LYMPHATICS: No lymphadenopathy CARDIAC: RRR, no murmurs, rubs, gallops RESPIRATORY:  Clear to auscultation without rales, wheezing or rhonchi  ABDOMEN: Soft, non-tender, non-distended MUSCULOSKELETAL:  No edema; No deformity  SKIN: Warm and dry NEUROLOGIC:  Alert and oriented x 3 PSYCHIATRIC:  Normal affect    Signed, Norman Herrlich, MD  08/21/2022 3:17 PM     [facility]

## 2022-08-20 NOTE — H&P (View-Only) (Signed)
Cardiology Office Note:    Date:  08/21/2022   ID:  Jimmy Morrison, DOB 04-Mar-1965, MRN [id number]  PCP:  Lise Auer, MD  Cardiologist:  Norman Herrlich, MD    Referring MD: Lise Auer, MD    ASSESSMENT:    1. Coronary artery disease involving native coronary artery of native heart with other form of angina pectoris (HCC)   2. Primary hypertension   3. Mixed hyperlipidemia    PLAN:    In order of problems listed above:  He continues to be symptomatic left heart cath November 2022 showed moderate multivessel CAD with occlusion of the proximal to mid right coronary artery and 50% diagonal stenosis.  I think he benefit from repeat coronary angiography possible percutaneous intervention and perhaps even consideration of total occlusion PCI.  Will continue his current multidrug regimen including antianginals and multiple lipid-lowering medications statin Zetia fenofibrate and icosapent ethyl. Stable hypertension continue his current antihypertensive agents And care continue current intense lipid-lowering treatment his LDL is at target   Next appointment: 3 months   Medication Adjustments/Labs and Tests Ordered: Current medicines are reviewed at length with the patient today.  Concerns regarding medicines are outlined above.  No orders of the defined types were placed in this encounter.  No orders of the defined types were placed in this encounter.    History of Present Illness:    Jimmy Morrison is a 57 y.o. male with a hx of coronary artery disease hypertension hyperlipidemia COPD and obstructive sleep apnea last seen 07/28/2022 by Wallis Bamberg nurse practitioner following a myocardial perfusion study showing mild ischemia.  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  He had  exercise intolerance abnormal perfusion study medications were increased adding ranolazine and unfortunately he continues to feel badly He has developed marked exercise intolerance and has exertional angina when walking to his mailbox perhaps 50 foot he is on improved and is unhappy with the quality of his life.  He has not had rest or nocturnal episodes of angina.  He is on a good medical regimen including aspirin calcium channel blocker and ranolazine.  With his ongoing limiting symptoms advised to undergo coronary angiography options risk and benefits detailed.  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 ICD-10 cut over Formatting of this note might be different from the original. 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   albuterol (VENTOLIN HFA) 108 (90 Base) MCG/ACT inhaler Inhale 2 puffs into the lungs 2 (two) times daily.   amLODipine (NORVASC) 2.5 MG tablet Take 2.5 mg by mouth daily.   aspirin EC 81 MG tablet Take 81 mg by mouth at bedtime. Swallow whole.   busPIRone (BUSPAR) 5 MG tablet Take 5 mg by mouth 3 (three) times daily as needed (anxiety).   cyclobenzaprine (FLEXERIL) 10 MG tablet Take 10 mg by mouth as needed for muscle spasms.   DULoxetine (CYMBALTA) 60 MG capsule Take 60 mg by mouth every morning.   ezetimibe (ZETIA) 10 MG tablet Take 10 mg by mouth at bedtime.   fenofibrate (TRICOR) 145 MG tablet Take 145 mg by mouth at bedtime.   fluticasone (FLONASE) 50 MCG/ACT nasal spray Place 1-2 sprays into both nostrils daily as needed for allergies or rhinitis.   ipratropium-albuterol (DUONEB) 0.5-2.5 (3) MG/3ML SOLN Inhale 3 mLs into the lungs daily.   magnesium oxide (MAG-OX) 400 MG tablet Take 400 mg by mouth daily.   montelukast (SINGULAIR) 10 MG tablet Take 10 mg by mouth every morning.   naloxone (NARCAN) nasal spray 4 mg/0.1 mL Place 1 spray into the nose Once PRN.   nitroGLYCERIN (NITROSTAT) 0.4 MG SL tablet Place 1 tablet (0.4 mg total) under the tongue every 5 (five) minutes x 3 doses as needed for chest pain.   omeprazole (PRILOSEC) 40 MG capsule Take 40 mg by mouth 2 (two) times daily.   ondansetron (ZOFRAN-ODT) 4 MG disintegrating tablet Take 4 mg by mouth every 6 (six) hours as needed for nausea.   oxyCODONE (OXY IR/ROXICODONE) 5 MG immediate release tablet Take 5 mg by mouth every 6 (six) hours as needed for moderate pain or severe pain.   ranolazine (RANEXA) 500 MG 12 hr tablet Take 1 tablet (500 mg total) by mouth 2 (two) times daily.   rosuvastatin (CRESTOR) 40 MG tablet Take 1 tablet (40 mg total) by mouth  daily at 6 PM.   SPIRIVA RESPIMAT 1.25 MCG/ACT AERS Inhale 2 puffs into the lungs daily.   STIOLTO RESPIMAT 2.5-2.5 MCG/ACT AERS Inhale 2.5 each into the lungs daily.   tiZANidine (ZANAFLEX) 2 MG tablet Take 2 mg by mouth at bedtime.   torsemide (DEMADEX) 10 MG tablet Take 1 tablet (10 mg total) by mouth daily as needed (Swelling and fluid retention).   traZODone (DESYREL) 100 MG tablet Take 100-200 mg by mouth at bedtime.   valsartan (DIOVAN) 80 MG tablet Take 80 mg by mouth every morning.   VASCEPA 1 g capsule Take 2 g by mouth 2 (two) times daily.   vitamin B-12 (CYANOCOBALAMIN) 1000 MCG tablet Take 1,000 mcg by mouth daily.      EKGs/Labs/Other Studies Reviewed:    The following studies were reviewed today:  Cardiac Studies & Procedures   CARDIAC CATHETERIZATION  CARDIAC CATHETERIZATION 12/24/2020  Narrative   1st Diag lesion is 50% stenosed.   Prox RCA to Mid RCA lesion is 100% stenosed.   Prox Cx lesion is 20% stenosed.   Mid Cx lesion  is 20% stenosed.  Moderate multivessel CAD with with 50% stenosis in the first diagonal branch of the LAD with otherwise normal-appearing LAD; nonobstructive 20% stenoses in the circumflex vessel; and chronic appearing total occlusion of the proximal RCA with antegrade bridging collateralization as well as retrograde left to right collateralization to the distal vessel.  Normal LV contractility with EF estimated 50 to 55% without definitive segmental wall motion abnormalities.  LVEDP 10 mmHg.  RECOMMENDATION: Medical therapy.  Patient was bradycardic at [facility].  Consider adding isosorbide and/or amlodipine for anti-ischemic benefit.  Aggressive lipid-lowering therapy with target LDL less than 70.  Findings Coronary Findings Diagnostic  Dominance: Right  Left Anterior Descending  First Diagonal Branch 1st Diag lesion is 50% stenosed.  Left Circumflex Prox Cx lesion is 20% stenosed. Mid Cx lesion is 20% stenosed.  Right  Coronary Artery Prox RCA to Mid RCA lesion is 100% stenosed.  Acute Marginal Branch Collaterals Acute Mrg filled by collaterals from RV Branch.  Third Right Posterolateral Branch Collaterals 3rd RPL filled by collaterals from 1st Sept.  Intervention  No interventions have been documented.   STRESS TESTS  MYOCARDIAL PERFUSION IMAGING 07/11/2022  Narrative   Findings are consistent with mild ischemia involving apical and mid portion of the inferior walland no infarction. The study is intermediate risk.   No ST deviation was noted.   Left ventricular function is normal. Nuclear stress EF: 61 %. The left ventricular ejection fraction is normal (55-65%). End diastolic cavity size is normal.   Prior study available for comparison from 12/22/2020.                  Recent Labs: 06/27/2022: ALT 12; BUN 6; Creatinine, Ser 1.24; Hemoglobin 16.9; Platelets 205; Potassium 4.2; Sodium 136; TSH 1.330  Recent Lipid Panel    Component Value Date/Time   CHOL 98 (L) 06/27/2022 1508   TRIG 123 06/27/2022 1508   HDL 42 06/27/2022 1508   CHOLHDL 2.3 06/27/2022 1508   CHOLHDL 4.0 12/24/2020 0128   VLDL 51 (H) 12/24/2020 0128   LDLCALC 34 06/27/2022 1508    Physical Exam:    VS:  BP 118/62   Pulse 60   Ht 5\' 9"  (1.753 m)   Wt 215 lb 12.8 oz (97.9 kg)   SpO2 93%   BMI 31.87 kg/m     Wt Readings from Last 3 Encounters:  08/21/22 215 lb 12.8 oz (97.9 kg)  07/28/22 217 lb 3.2 oz (98.5 kg)  07/11/22 220 lb (99.8 kg)     GEN:  Well nourished, well developed in no acute distress HEENT: Normal NECK: No JVD; No carotid bruits LYMPHATICS: No lymphadenopathy CARDIAC: RRR, no murmurs, rubs, gallops RESPIRATORY:  Clear to auscultation without rales, wheezing or rhonchi  ABDOMEN: Soft, non-tender, non-distended MUSCULOSKELETAL:  No edema; No deformity  SKIN: Warm and dry NEUROLOGIC:  Alert and oriented x 3 PSYCHIATRIC:  Normal affect    Signed, Norman Herrlich, MD  08/21/2022 3:17 PM     [facility]

## 2022-08-21 ENCOUNTER — Ambulatory Visit: Payer: 59 | Attending: Cardiology | Admitting: Cardiology

## 2022-08-21 ENCOUNTER — Encounter: Payer: Self-pay | Admitting: Cardiology

## 2022-08-21 VITALS — BP 118/62 | HR 60 | Ht 69.0 in | Wt 215.8 lb

## 2022-08-21 DIAGNOSIS — I25118 Atherosclerotic heart disease of native coronary artery with other forms of angina pectoris: Secondary | ICD-10-CM

## 2022-08-21 DIAGNOSIS — E782 Mixed hyperlipidemia: Secondary | ICD-10-CM | POA: Diagnosis not present

## 2022-08-21 DIAGNOSIS — I10 Essential (primary) hypertension: Secondary | ICD-10-CM | POA: Diagnosis not present

## 2022-08-21 NOTE — Addendum Note (Signed)
Addended by: Roxanne Mins I on: 08/21/2022 03:52 PM   Modules accepted: Orders

## 2022-08-21 NOTE — Patient Instructions (Signed)
Medication Instructions:  Your physician recommends that you continue on your current medications as directed. Please refer to the Current Medication list given to you today.  *If you need a refill on your cardiac medications before your next appointment, please call your pharmacy*   Lab Work: Your physician recommends that you return for lab work in:   Labs today: CBC, BMP  If you have labs (blood work) drawn today and your tests are completely normal, you will receive your results only by: MyChart Message (if you have MyChart) OR A paper copy in the mail If you have any lab test that is abnormal or we need to change your treatment, we will call you to review the results.   Testing/Procedures:  [place] A DEPT OF [facility] AT  [address] [place] [address] Dept: [phone] Loc: [phone]  KIEV LABROSSE  08/21/2022  You are scheduled for a Cardiac Catheterization on Thursday, August 1 with Dr. Alverda Skeans.  1. Please arrive at the [facility] (Main Entrance A) at [facility]: [address] [place], [place] [address] at 8:30 AM (This time is 2 hour(s) before your procedure to ensure your preparation). Free valet parking service is available. You will check in at ADMITTING. The support person will be asked to wait in the waiting room.  It is OK to have someone drop you off and come back when you are ready to be discharged.    Special note: Every effort is made to have your procedure done on time. Please understand that emergencies sometimes delay scheduled procedures.  2. Diet: Do not eat solid foods after midnight.  The patient may have clear liquids until 5am upon the day of the procedure.  3. Labs: You will need to have blood drawn on Monday, July 22 at [facility]: [address] [place] . You do not need to be fasting.  4. Medication instructions in preparation for your  procedure:  Hold Torsemide the morning of the procedure.   Contrast Allergy: No  On the morning of your procedure, take your Aspirin 81 mg and any morning medicines NOT listed above.  You may use sips of water.  5. Plan to go home the same day, you will only stay overnight if medically necessary. 6. Bring a current list of your medications and current insurance cards. 7. You MUST have a responsible person to drive you home. 8. Someone MUST be with you the first 24 hours after you arrive home or your discharge will be delayed. 9. Please wear clothes that are easy to get on and off and wear slip-on shoes.  Thank you for allowing [place] to care for you!   -- [facility]    Follow-Up: At [facility], you and your health needs are our priority.  As part of our continuing mission to provide you with exceptional heart care, we have created designated Provider Care Teams.  These Care Teams include your primary Cardiologist (physician) and Advanced Practice Providers (APPs -  Physician Assistants and Nurse Practitioners) who all work together to provide you with the care you need, when you need it.  We recommend signing up for the patient portal called "MyChart".  Sign up information is provided on this After Visit Summary.  MyChart is used to connect with patients for Virtual Visits (Telemedicine).  Patients are able to view lab/test results, encounter notes, upcoming appointments, etc.  Non-urgent messages can be sent to your provider as well.   To learn more about what you can do with MyChart, go to [email or web address].    Your next appointment:   6 week(s)  Provider:   Norman Herrlich, MD    Other Instructions None

## 2022-08-22 LAB — BASIC METABOLIC PANEL
BUN/Creatinine Ratio: 6 — ABNORMAL LOW (ref 9–20)
BUN: 7 mg/dL (ref 6–24)
CO2: 24 mmol/L (ref 20–29)
Calcium: 9.4 mg/dL (ref 8.7–10.2)
Chloride: 103 mmol/L (ref 96–106)
Creatinine, Ser: 1.11 mg/dL (ref 0.76–1.27)
Glucose: 100 mg/dL — ABNORMAL HIGH (ref 70–99)
Potassium: 4.1 mmol/L (ref 3.5–5.2)
Sodium: 139 mmol/L (ref 134–144)
eGFR: 78 mL/min/{1.73_m2} (ref >59)

## 2022-08-22 LAB — CBC
Hematocrit: 44.3 % (ref 37.5–51.0)
Hemoglobin: 15.6 g/dL (ref 13.0–17.7)
MCH: 31.7 pg (ref 26.6–33.0)
MCHC: 35.2 g/dL (ref 31.5–35.7)
MCV: 90 fL (ref 79–97)
Platelets: 188 10*3/uL (ref 150–450)
RBC: 4.92 x10E6/uL (ref 4.14–5.80)
RDW: 12.8 % (ref 11.6–15.4)
WBC: 8.8 10*3/uL (ref 3.4–10.8)

## 2022-08-27 NOTE — Progress Notes (Unsigned)
[facility] [place]  ASSESSMENT / PLAN: 57 y.o. male with bilateral common iliac artery dissections on CT scan from September 2023. This was re-demonstrated on CT scan from June 2024. He has a normal clinical vascular exam. He describes bilateral lower extremity pain concerning for a musculoskeletal cause of his symptoms.  I reviewed the rationale for surveillance of his common iliac artery dissections.  I will see him back in a year with a CT angiogram and ankle-brachial index. He needs best medical therapy for atherosclerosis:  Abstinence from all tobacco products. Blood glucose control with goal A1c < 7%. Blood pressure control with goal blood pressure < 140/90 mmHg. Lipid reduction therapy with goal LDL-C <100 mg/dL. Aspirin 81mg  PO QD.  Atorvastatin 40-80mg  PO QD (or other "high intensity" statin therapy).   CHIEF COMPLAINT: imaging follow up  HISTORY OF PRESENT ILLNESS: Jimmy Morrison is a 57 y.o. male who presents to clinic for evaluation of bilateral common iliac artery dissections, identified incidentally on CT scan done in September 2023 for acute appendicitis.  He has a strong personal history of degenerative spinal disease and has undergone multiple back and neck surgeries.  He reports significant discomfort in his bilateral lower extremities.  This is present when standing or walking for long period of time.  Resting does not resolve the symptoms.  He does not endorse typical claudication symptoms.  He has no rest pain about his feet.  He has no ulcers or wounds.     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 ICD-10 cut over Formatting of this note might be different from the original. 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   Obesity (BMI 30.0-34.9) 01/22/2012   OSA (obstructive sleep apnea)    Pain syndrome, chronic 02/22/2012   Postlaminectomy syndrome, lumbar region 01/07/2009   Radiculopathy of cervical spine 05/12/2020   Right arm pain 04/22/2020   S/P cervical spinal fusion 05/28/2020   Tobacco use 12/24/2020   Ulnar neuropathy at elbow of right upper extremity 10/26/2020    Past Surgical History:  Procedure Laterality Date   ANTERIOR CERVICAL DECOMP/DISCECTOMY FUSION N/A 05/12/2020   Procedure: Anterior Cervical Decompression Fusion - Cervical five-Cervical six, removal of Cervical six-seven plate;  Surgeon: Bedelia Person, MD;  Location: [facility] OR;  Service: Neurosurgery;  Laterality: N/A;   ANTERIOR FUSION CERVICAL SPINE     BACK SURGERY     CARPAL TUNNEL RELEASE Right    COLONOSCOPY W/ POLYPECTOMY     LEFT HEART CATH AND CORONARY ANGIOGRAPHY N/A 12/24/2020   Procedure: LEFT HEART CATH AND CORONARY ANGIOGRAPHY;  Surgeon: Lennette Bihari, MD;  Location: [facility] INVASIVE CV LAB;  Service: Cardiovascular;  Laterality: N/A;  LUMBAR NERVE STIMLATOR INSERTION  10/2010   And Cervical nerve stimulator   SHOULDER ARTHROSCOPY Left    cleaned up after accident    Family History  Problem Relation Age of Onset   Hypertension Mother    Hypertension Sister    Hypertension Brother    Heart attack Maternal Aunt     Social History   Socioeconomic History   Marital  status: Married    Spouse name: Not on file   Number of children: Not on file   Years of education: Not on file   Highest education level: Not on file  Occupational History   Not on file  Tobacco Use   Smoking status: Every Day    Current packs/day: 1.00    Average packs/day: 1 pack/day for 35.0 years (35.0 ttl pk-yrs)    Types: Cigarettes   Smokeless tobacco: Never  Vaping Use   Vaping status: Never Used  Substance and Sexual Activity   Alcohol use: Not Currently   Drug use: Not Currently   Sexual activity: Not on file  Other Topics Concern   Not on file  Social History Narrative   Not on file   Social Determinants of Health   Financial Resource Strain: Not on file  Food Insecurity: Food Insecurity Present (03/04/2020)   Hunger Vital Sign    Worried About Running Out of Food in the Last Year: Sometimes true    Ran Out of Food in the Last Year: Sometimes true  Transportation Needs: No Transportation Needs (03/04/2020)   PRAPARE - Administrator, Civil Service (Medical): No    Lack of Transportation (Non-Medical): No  Physical Activity: Not on file  Stress: Not on file  Social Connections: Not on file  Intimate Partner Violence: Not on file    Allergies  Allergen Reactions   Imdur [Isosorbide Nitrate] Other (See Comments)    Tongue swelling    Pregabalin     Bumps in mouth   Aspirin     Nose bleed   Bupropion     Bumps in mouth   Gabapentin Swelling    Tongue swelling   Isosorbide Other (See Comments)    Swollen tongue/ headaches   Prednisone     Mood changes    Current Outpatient Medications  Medication Sig Dispense Refill   acetaminophen (TYLENOL) 650 MG CR tablet Take 1,300 mg by mouth every 8 (eight) hours as needed for pain.     albuterol (VENTOLIN HFA) 108 (90 Base) MCG/ACT inhaler Inhale 2 puffs into the lungs 2 (two) times daily.     amLODipine (NORVASC) 2.5 MG tablet Take 2.5 mg by mouth daily.     aspirin EC 81 MG tablet Take 81 mg by  mouth at bedtime. Swallow whole.     busPIRone (BUSPAR) 5 MG tablet Take 5 mg by mouth 3 (three) times daily as needed (anxiety).     cyclobenzaprine (FLEXERIL) 10 MG tablet Take 10 mg by mouth as needed for muscle spasms.     DULoxetine (CYMBALTA) 60 MG capsule Take 60 mg by mouth every morning.     ezetimibe (ZETIA) 10 MG tablet Take 10 mg by mouth at bedtime.     fenofibrate (TRICOR) 145 MG tablet Take 145 mg by mouth at bedtime.     fluticasone (FLONASE) 50 MCG/ACT nasal spray Place 1-2 sprays into both nostrils daily as needed for allergies or rhinitis.     ipratropium-albuterol (DUONEB) 0.5-2.5 (3) MG/3ML SOLN Inhale 3 mLs into  the lungs daily.     magnesium oxide (MAG-OX) 400 MG tablet Take 400 mg by mouth daily.     montelukast (SINGULAIR) 10 MG tablet Take 10 mg by mouth every morning.     naloxone (NARCAN) nasal spray 4 mg/0.1 mL Place 1 spray into the nose Once PRN.     nitroGLYCERIN (NITROSTAT) 0.4 MG SL tablet Place 1 tablet (0.4 mg total) under the tongue every 5 (five) minutes x 3 doses as needed for chest pain. 25 tablet 1   omeprazole (PRILOSEC) 40 MG capsule Take 40 mg by mouth 2 (two) times daily.     ondansetron (ZOFRAN-ODT) 4 MG disintegrating tablet Take 4 mg by mouth every 6 (six) hours as needed for nausea.     oxyCODONE (OXY IR/ROXICODONE) 5 MG immediate release tablet Take 5 mg by mouth every 6 (six) hours as needed for moderate pain or severe pain.     ranolazine (RANEXA) 500 MG 12 hr tablet Take 1 tablet (500 mg total) by mouth 2 (two) times daily. 180 tablet 3   rosuvastatin (CRESTOR) 40 MG tablet Take 1 tablet (40 mg total) by mouth daily at 6 PM. 90 tablet 2   SPIRIVA RESPIMAT 1.25 MCG/ACT AERS Inhale 2 puffs into the lungs daily.     STIOLTO RESPIMAT 2.5-2.5 MCG/ACT AERS Inhale 2.5 each into the lungs daily.     tiZANidine (ZANAFLEX) 2 MG tablet Take 2 mg by mouth at bedtime.     torsemide (DEMADEX) 10 MG tablet Take 1 tablet (10 mg total) by mouth daily as needed  (Swelling and fluid retention). 90 tablet 3   traZODone (DESYREL) 100 MG tablet Take 100-200 mg by mouth at bedtime.     valsartan (DIOVAN) 80 MG tablet Take 80 mg by mouth every morning.     VASCEPA 1 g capsule Take 2 g by mouth 2 (two) times daily.     vitamin B-12 (CYANOCOBALAMIN) 1000 MCG tablet Take 1,000 mcg by mouth daily.     No current facility-administered medications for this visit.    PHYSICAL EXAM Vitals:   08/28/22 0902  BP: 128/82  Pulse: (!) 52  Resp: 20  Temp: 98 F (36.7 C)  SpO2: 93%  Weight: 215 lb 12.8 oz (97.9 kg)  Height: 5\' 9"  (1.753 m)    Middle-age man in no acute distress Regular rate and rhythm Unlabored breathing 2+ dorsalis pedis pulses bilaterally  PERTINENT LABORATORY AND RADIOLOGIC DATA  Most recent CBC    Latest Ref Rng & Units 08/21/2022   12:00 AM 06/27/2022    3:08 PM 12/24/2020    1:28 AM  CBC  WBC 3.4 - 10.8 x10E3/uL 8.8  7.8  7.4   Hemoglobin 13.0 - 17.7 g/dL 40.9  81.1  91.4   Hematocrit 37.5 - 51.0 % 44.3  48.1  42.2   Platelets 150 - 450 x10E3/uL 188  205  168      Most recent CMP    Latest Ref Rng & Units 08/21/2022   12:00 AM 06/27/2022    3:08 PM 08/29/2021    2:28 PM  CMP  Glucose 70 - 99 mg/dL 782  90  96   BUN 6 - 24 mg/dL 7  6  8    Creatinine 0.76 - 1.27 mg/dL 9.56  2.13  0.86   Sodium 134 - 144 mmol/L 139  136  141   Potassium 3.5 - 5.2 mmol/L 4.1  4.2  3.8   Chloride 96 - 106 mmol/L  103  103  104   CO2 20 - 29 mmol/L 24  18  20    Calcium 8.7 - 10.2 mg/dL 9.4  9.8  9.9   Total Protein 6.0 - 8.5 g/dL  6.9  6.8   Total Bilirubin 0.0 - 1.2 mg/dL  0.5  0.2   Alkaline Phos 44 - 121 IU/L  54  50   AST 0 - 40 IU/L  14  10   ALT 0 - 44 IU/L  12  9     Renal function Estimated Creatinine Clearance: 85.8 mL/min (by C-G formula based on SCr of 1.11 mg/dL).  No results found for: "HGBA1C"  LDL Chol Calc (NIH)  Date Value Ref Range Status  06/27/2022 34 0 - 99 mg/dL Final    CT angiogram chest / abdomen / pelvis  personally reviewed. Small non-flow limiting dissections in bilateral common iliac arteries.   Rande Brunt. Lenell Antu, MD [facility] Phone Number: [phone] 08/27/2022 7:29 PM   Total time spent on preparing this encounter including chart review, data review, collecting history, examining the patient, coordinating care for this new patient, 60 minutes.  Portions of this report may have been transcribed using voice recognition software.  Every effort has been made to ensure accuracy; however, inadvertent computerized transcription errors may still be present.

## 2022-08-28 ENCOUNTER — Ambulatory Visit (INDEPENDENT_AMBULATORY_CARE_PROVIDER_SITE_OTHER): Payer: 59 | Admitting: Vascular Surgery

## 2022-08-28 ENCOUNTER — Encounter: Payer: Self-pay | Admitting: Vascular Surgery

## 2022-08-28 VITALS — BP 128/82 | HR 52 | Temp 98.0°F | Resp 20 | Ht 69.0 in | Wt 215.8 lb

## 2022-08-28 DIAGNOSIS — I7772 Dissection of iliac artery: Secondary | ICD-10-CM

## 2022-08-30 ENCOUNTER — Telehealth: Payer: Self-pay | Admitting: *Deleted

## 2022-08-30 NOTE — Telephone Encounter (Addendum)
Cardiac Catheterization scheduled at [facility] for: Thursday August 31, 2022 10:30 AM Arrival time [facility] Main Entrance A at: 8:30 AM  Nothing to eat after midnight prior to procedure, clear liquids until 5 AM day of procedure.  Medication instructions: -Hold:  Torsemide-AM of procedure -Other usual morning medications can be taken with sips of water including aspirin 81 mg.  Confirmed patient has responsible adult to drive home post procedure and be with patient first 24 hours after arriving home.  Plan to go home the same day, you will only stay overnight if medically necessary.

## 2022-08-30 NOTE — Telephone Encounter (Signed)
Pt returning nurse's call. Please advise

## 2022-08-30 NOTE — Telephone Encounter (Signed)
Call returned to patient, procedure instructions reviewed.

## 2022-08-31 ENCOUNTER — Ambulatory Visit (HOSPITAL_COMMUNITY)
Admission: RE | Admit: 2022-08-31 | Discharge: 2022-08-31 | Disposition: A | Discharge status: Home / Self Care | Payer: 59 | Attending: Internal Medicine | Admitting: Internal Medicine

## 2022-08-31 ENCOUNTER — Encounter (HOSPITAL_COMMUNITY)
Admission: RE | Disposition: A | Discharge status: Home / Self Care | Payer: Self-pay | Source: Home / Self Care | Attending: Internal Medicine

## 2022-08-31 ENCOUNTER — Other Ambulatory Visit: Payer: Self-pay

## 2022-08-31 DIAGNOSIS — E782 Mixed hyperlipidemia: Secondary | ICD-10-CM | POA: Insufficient documentation

## 2022-08-31 DIAGNOSIS — G4733 Obstructive sleep apnea (adult) (pediatric): Secondary | ICD-10-CM | POA: Insufficient documentation

## 2022-08-31 DIAGNOSIS — I10 Essential (primary) hypertension: Secondary | ICD-10-CM | POA: Insufficient documentation

## 2022-08-31 DIAGNOSIS — I25118 Atherosclerotic heart disease of native coronary artery with other forms of angina pectoris: Secondary | ICD-10-CM | POA: Insufficient documentation

## 2022-08-31 DIAGNOSIS — I2584 Coronary atherosclerosis due to calcified coronary lesion: Secondary | ICD-10-CM | POA: Diagnosis not present

## 2022-08-31 DIAGNOSIS — I25119 Atherosclerotic heart disease of native coronary artery with unspecified angina pectoris: Secondary | ICD-10-CM | POA: Diagnosis not present

## 2022-08-31 DIAGNOSIS — J449 Chronic obstructive pulmonary disease, unspecified: Secondary | ICD-10-CM | POA: Insufficient documentation

## 2022-08-31 HISTORY — PX: LEFT HEART CATH AND CORONARY ANGIOGRAPHY: CATH118249

## 2022-08-31 SURGERY — LEFT HEART CATH AND CORONARY ANGIOGRAPHY
Anesthesia: LOCAL

## 2022-08-31 MED ORDER — FENTANYL CITRATE (PF) 100 MCG/2ML IJ SOLN
INTRAMUSCULAR | Status: DC | PRN
  Administered 2022-08-31: 25 ug via INTRAVENOUS

## 2022-08-31 MED ORDER — MIDAZOLAM HCL 2 MG/2ML IJ SOLN
INTRAMUSCULAR | Status: DC | PRN
  Administered 2022-08-31: 1 mg via INTRAVENOUS

## 2022-08-31 MED ORDER — SODIUM CHLORIDE 0.9 % WEIGHT BASED INFUSION: 1.0000 mL/kg/h | INTRAVENOUS | Status: DC

## 2022-08-31 MED ORDER — SODIUM CHLORIDE 0.9 % WEIGHT BASED INFUSION
3.0000 mL/kg/h | INTRAVENOUS | Status: DC
  Administered 2022-08-31: 3 mL/kg/h via INTRAVENOUS

## 2022-08-31 MED ORDER — SODIUM CHLORIDE 0.9 % IV SOLN: 250.0000 mL | INTRAVENOUS | Status: DC | PRN

## 2022-08-31 MED ORDER — LIDOCAINE HCL (PF) 1 % IJ SOLN
INTRAMUSCULAR | Status: AC
  Filled 2022-08-31: qty 30

## 2022-08-31 MED ORDER — LABETALOL HCL 5 MG/ML IV SOLN: 10.0000 mg | INTRAVENOUS | Status: DC | PRN

## 2022-08-31 MED ORDER — FENTANYL CITRATE (PF) 100 MCG/2ML IJ SOLN
INTRAMUSCULAR | Status: AC
  Filled 2022-08-31: qty 2

## 2022-08-31 MED ORDER — VERAPAMIL HCL 2.5 MG/ML IV SOLN
INTRAVENOUS | Status: DC | PRN
  Administered 2022-08-31: 10 mL via INTRA_ARTERIAL

## 2022-08-31 MED ORDER — SODIUM CHLORIDE 0.9% FLUSH: 3.0000 mL | Freq: Two times a day (BID) | INTRAVENOUS | Status: DC

## 2022-08-31 MED ORDER — SODIUM CHLORIDE 0.9 % IV SOLN: INTRAVENOUS | Status: DC

## 2022-08-31 MED ORDER — MIDAZOLAM HCL 2 MG/2ML IJ SOLN
INTRAMUSCULAR | Status: AC
  Filled 2022-08-31: qty 2

## 2022-08-31 MED ORDER — HEPARIN (PORCINE) IN NACL 1000-0.9 UT/500ML-% IV SOLN
INTRAVENOUS | Status: DC | PRN
  Administered 2022-08-31 (×2): 500 mL

## 2022-08-31 MED ORDER — ACETAMINOPHEN 325 MG PO TABS: 650.0000 mg | ORAL_TABLET | ORAL | Status: DC | PRN

## 2022-08-31 MED ORDER — HYDRALAZINE HCL 20 MG/ML IJ SOLN: 10.0000 mg | INTRAMUSCULAR | Status: DC | PRN

## 2022-08-31 MED ORDER — ONDANSETRON HCL 4 MG/2ML IJ SOLN: 4.0000 mg | Freq: Four times a day (QID) | INTRAMUSCULAR | Status: DC | PRN

## 2022-08-31 MED ORDER — HEPARIN SODIUM (PORCINE) 1000 UNIT/ML IJ SOLN
INTRAMUSCULAR | Status: AC
  Filled 2022-08-31: qty 10

## 2022-08-31 MED ORDER — HEPARIN SODIUM (PORCINE) 1000 UNIT/ML IJ SOLN
INTRAMUSCULAR | Status: DC | PRN
  Administered 2022-08-31: 5000 [IU] via INTRA_ARTERIAL

## 2022-08-31 MED ORDER — VERAPAMIL HCL 2.5 MG/ML IV SOLN
INTRAVENOUS | Status: AC
  Filled 2022-08-31: qty 2

## 2022-08-31 MED ORDER — IOHEXOL 350 MG/ML SOLN
INTRAVENOUS | Status: DC | PRN
  Administered 2022-08-31: 75 mL

## 2022-08-31 MED ORDER — LIDOCAINE HCL (PF) 1 % IJ SOLN
INTRAMUSCULAR | Status: DC | PRN
  Administered 2022-08-31: 2 mL

## 2022-08-31 MED ORDER — ASPIRIN 81 MG PO CHEW: 81.0000 mg | CHEWABLE_TABLET | ORAL | Status: DC

## 2022-08-31 MED ORDER — SODIUM CHLORIDE 0.9% FLUSH: 3.0000 mL | INTRAVENOUS | Status: DC | PRN

## 2022-08-31 SURGICAL SUPPLY — 10 items
CATH DIAG 6FR JR4 (CATHETERS) IMPLANT
CATH DIAG 6FR PIGTAIL ANGLED (CATHETERS) IMPLANT
CATH INFINITI AMBI 6FR TG (CATHETERS) IMPLANT
DEVICE RAD COMP TR BAND LRG (VASCULAR PRODUCTS) IMPLANT
GLIDESHEATH SLEND SS 6F .021 (SHEATH) IMPLANT
PACK CARDIAC CATHETERIZATION (CUSTOM PROCEDURE TRAY) ×1 IMPLANT
PROTECTION STATION PRESSURIZED (MISCELLANEOUS) ×1
SET ATX-X65L (MISCELLANEOUS) IMPLANT
STATION PROTECTION PRESSURIZED (MISCELLANEOUS) IMPLANT
WIRE EMERALD 3MM-J .035X260CM (WIRE) IMPLANT

## 2022-08-31 NOTE — Interval H&P Note (Signed)
History and Physical Interval Note:  08/31/2022 8:58 AM  Jimmy Morrison  has presented today for surgery, with the diagnosis of cad.  The various methods of treatment have been discussed with the patient and family. After consideration of risks, benefits and other options for treatment, the patient has consented to  Procedure(s): LEFT HEART CATH AND CORONARY ANGIOGRAPHY (N/A) as a surgical intervention.  The patient's history has been reviewed, patient examined, no change in status, stable for surgery.  I have reviewed the patient's chart and labs.  Questions were answered to the patient's satisfaction.    Cath Lab Visit (complete for each Cath Lab visit)  Clinical Evaluation Leading to the Procedure:   ACS: No.  Non-ACS:    Anginal Classification: CCS III  Anti-ischemic medical therapy: Maximal Therapy (2 or more classes of medications)  Non-Invasive Test Results: No non-invasive testing performed  Prior CABG: No previous CABG        Orbie Pyo

## 2022-08-31 NOTE — Progress Notes (Signed)
TR BAND REMOVAL  LOCATION:    right radial  DEFLATED PER PROTOCOL:    Yes.    TIME BAND OFF / DRESSING APPLIED:    1310 gauze dressing applied   SITE UPON ARRIVAL:    Level 0  SITE AFTER BAND REMOVAL:    Level 0  CIRCULATION SENSATION AND MOVEMENT:    Within Normal Limits   Yes.    COMMENTS:   no issues noted

## 2022-08-31 NOTE — Discharge Instructions (Signed)

## 2022-09-01 ENCOUNTER — Other Ambulatory Visit: Payer: Self-pay

## 2022-09-01 ENCOUNTER — Encounter (HOSPITAL_COMMUNITY): Payer: Self-pay | Admitting: Internal Medicine

## 2022-09-01 DIAGNOSIS — I7772 Dissection of iliac artery: Secondary | ICD-10-CM

## 2022-09-05 DIAGNOSIS — M961 Postlaminectomy syndrome, not elsewhere classified: Secondary | ICD-10-CM | POA: Diagnosis not present

## 2022-09-05 DIAGNOSIS — G4733 Obstructive sleep apnea (adult) (pediatric): Secondary | ICD-10-CM | POA: Diagnosis not present

## 2022-09-05 DIAGNOSIS — K625 Hemorrhage of anus and rectum: Secondary | ICD-10-CM | POA: Diagnosis not present

## 2022-10-24 NOTE — Progress Notes (Unsigned)
Cardiology Office Note:    Date:  10/25/2022   ID:  Jimmy Morrison, DOB [birth year], MRN [id number]  PCP:  Lise Auer, MD  Cardiologist:  Norman Herrlich, MD    Referring MD: Lise Auer, MD    ASSESSMENT:    1. Coronary artery disease involving native coronary artery of native heart with other form of angina pectoris (HCC)   2. Hypertensive heart disease, unspecified whether heart failure present   3. Mixed hyperlipidemia    PLAN:    In order of problems listed above:  Stable anatomy and recent coronary angiogram not felt to be a good candidate for CTO occlusion PCI optimize medical therapy by adding clopidogrel to aspirin colchicine anti-inflammatory effect and screen for LP(a) and check apolipoprotein B demise treatment.  He assures me he will get 30 minutes of activity daily if he has severe limiting symptoms could consider referral to an outside system like [facility] for reconsideration of CTO PCI.  Continue his ranolazine calcium channel blocker.  Resting heart rate in the 60s without beta-blocker Well-controlled continue current treatment including minimum dose of loop diuretic and his current antihypertensives Status questionable I will continue Zetia fenofibrate and rosuvastatin.   Next appointment: 3 months   Medication Adjustments/Labs and Tests Ordered: Current medicines are reviewed at length with the patient today.  Concerns regarding medicines are outlined above.  No orders of the defined types were placed in this encounter.  No orders of the defined types were placed in this encounter.    History of Present Illness:    Jimmy Morrison is a 57 y.o. male with a hx of CAD hypertension hyperlipidemia c/o PND and obstructive sleep apnea last seen 08/21/2022.  He had a myocardial perfusion study performed showing the presence of ischemia and referred to coronary angiography.  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  He had recent left catheterization 08/31/2022.  Coronary anatomy was unchanged with chronic total occlusion of proximal right coronary artery well collateralized and occlusion of the small second obtuse marginal artery and no significant stenosis remainder of his coronary vessels.  He was reviewed for the potential left CTO PCI was felt to be best treated medically.  Compliance with diet, lifestyle and medications: Yes  He is a little frustrated and we talked about optimizing therapy for his CAD Will screen him for LP(a) if elevators I would initiate PCSK9 inhibitor along with his statin and Zetia Initiate colchicine secondary prevention 0.6 mg daily Finally check apolipoprotein B to see if he is at clinical optimal. He has occasional angina stable pattern exertional No edema shortness of breath palpitation or syncope His blood pressure is at target today He assures me he will get 30 minutes of activity daily His last LDL was 34 with non-HDL cholesterol 56 Past Medical History:  Diagnosis Date   Abnormal stress test 12/23/2020   Anxiety    Arthritis    Arthrodesis status 06/29/2020   Bipolar disorder (HCC)    Brachial neuritis 02/10/2009   Carpal tunnel syndrome    COPD (chronic obstructive pulmonary disease) (HCC)    Coronary artery disease    Degenerative disc disease, cervical 01/22/2012   Degenerative disc disease, lumbar 01/22/2012   Depression    Displacement of lumbar intervertebral disc 02/10/2009   Elevated blood-pressure reading, without diagnosis of hypertension 09/28/2020   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 ICD-10 cut over Formatting of this note might be different from the original. 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   albuterol (VENTOLIN HFA) 108 (90 Base) MCG/ACT inhaler Inhale 2 puffs into the lungs 2 (two) times daily.   amLODipine (NORVASC) 2.5 MG tablet Take 2.5 mg by mouth daily.   aspirin EC 81 MG tablet Take 81 mg by mouth at bedtime. Swallow whole.   busPIRone (BUSPAR) 5 MG tablet Take 5 mg by mouth 3 (three) times daily as needed (anxiety).   cyclobenzaprine (FLEXERIL) 10 MG tablet Take 10 mg by mouth 3 (three) times daily as needed for muscle spasms.   DULoxetine (CYMBALTA) 60 MG capsule Take 60 mg by mouth every morning.   ezetimibe (ZETIA) 10 MG tablet Take 10 mg by mouth at bedtime.   fenofibrate (TRICOR) 145 MG tablet Take 145 mg by mouth at bedtime.   fluticasone (FLONASE) 50 MCG/ACT nasal spray Place 1-2 sprays into both nostrils daily as needed for allergies or rhinitis.   ipratropium-albuterol (DUONEB) 0.5-2.5 (3) MG/3ML SOLN Inhale 3 mLs into the lungs daily.   magnesium oxide (MAG-OX) 400 MG tablet Take 400 mg by mouth daily.   montelukast (SINGULAIR) 10 MG tablet Take 10 mg by mouth every morning.   naloxone (NARCAN) nasal spray 4 mg/0.1 mL Place 1 spray into the nose Once PRN.   nitroGLYCERIN (NITROSTAT) 0.4 MG SL tablet Place 1 tablet (0.4 mg total) under the tongue every 5 (five) minutes x 3 doses as needed for chest pain.   omeprazole (PRILOSEC) 40 MG capsule Take 40 mg by mouth 2 (two) times daily.   ondansetron (ZOFRAN-ODT) 4 MG disintegrating tablet Take 4 mg  by mouth every 6 (six) hours as needed for nausea.   oxyCODONE (OXY IR/ROXICODONE) 5 MG immediate release tablet Take 5 mg by mouth every 6 (six) hours as needed for moderate pain or severe pain.   ranolazine (RANEXA) 500 MG 12 hr tablet Take 1 tablet (500 mg total) by mouth 2 (two) times daily.   rosuvastatin (CRESTOR) 40 MG tablet Take 1 tablet (40 mg total) by mouth daily at 6 PM.   STIOLTO RESPIMAT 2.5-2.5 MCG/ACT AERS Inhale 1 puff into the lungs daily.   tiZANidine (ZANAFLEX) 2 MG tablet Take 2 mg by mouth at bedtime.   torsemide (DEMADEX) 10 MG tablet Take 1 tablet (10 mg total) by mouth daily as needed (Swelling and fluid retention).   traZODone (DESYREL) 100 MG tablet Take 100-200 mg by mouth at bedtime.   valsartan (DIOVAN) 80 MG tablet Take 80 mg by mouth every morning.   VASCEPA 1 g capsule Take 2 g by mouth 2 (two) times daily.   vitamin B-12 (CYANOCOBALAMIN) 1000 MCG tablet Take 1,000 mcg by mouth daily.  EKGs/Labs/Other Studies Reviewed:    The following studies were reviewed today:  Cardiac Studies & Procedures   CARDIAC CATHETERIZATION  CARDIAC CATHETERIZATION 08/31/2022  Narrative   Prox Cx lesion is 20% stenosed.   Mid Cx lesion is 20% stenosed.   Prox RCA to Mid RCA lesion is 100% stenosed.   1st Diag lesion is 50% stenosed.   2nd Mrg lesion is 100% stenosed.  1.  Relatively unchanged burden of coronary artery disease with CTO of proximal right coronary artery collateralized by the left system and CTO small second obtuse marginal with mild disease elsewhere. 2.  LVEDP of 14 mmHg.   Recommendation: Medical management.  The results were reviewed with Dr. Swaz[place] of the [facility] CTO PCI team whose opinion is that the right coronary artery is not amenable to CTO PCI.  Findings Coronary Findings Diagnostic  Dominance: Right  Left Anterior Descending  First Diagonal Branch 1st Diag lesion is 50% stenosed.  Left Circumflex Prox Cx lesion is 20%  stenosed. Mid Cx lesion is 20% stenosed.  Second Obtuse Marginal Branch Collaterals 2nd Mrg filled by collaterals from 1st Diag.  2nd Mrg lesion is 100% stenosed.  Right Coronary Artery Prox RCA to Mid RCA lesion is 100% stenosed.  Acute Marginal Branch Collaterals Acute Mrg filled by collaterals from RV Branch.  Third Right Posterolateral Branch Collaterals 3rd RPL filled by collaterals from 1st Sept.  Intervention  No interventions have been documented.   CARDIAC CATHETERIZATION  CARDIAC CATHETERIZATION 12/24/2020  Narrative   1st Diag lesion is 50% stenosed.   Prox RCA to Mid RCA lesion is 100% stenosed.   Prox Cx lesion is 20% stenosed.   Mid Cx lesion is 20% stenosed.  Moderate multivessel CAD with with 50% stenosis in the first diagonal branch of the LAD with otherwise normal-appearing LAD; nonobstructive 20% stenoses in the circumflex vessel; and chronic appearing total occlusion of the proximal RCA with antegrade bridging collateralization as well as retrograde left to right collateralization to the distal vessel.  Normal LV contractility with EF estimated 50 to 55% without definitive segmental wall motion abnormalities.  LVEDP 10 mmHg.  RECOMMENDATION: Medical therapy.  Patient was bradycardic at [facility].  Consider adding isosorbide and/or amlodipine for anti-ischemic benefit.  Aggressive lipid-lowering therapy with target LDL less than 70.  Findings Coronary Findings Diagnostic  Dominance: Right  Left Anterior Descending  First Diagonal Branch 1st Diag lesion is 50% stenosed.  Left Circumflex Prox Cx lesion is 20% stenosed. Mid Cx lesion is 20% stenosed.  Right Coronary Artery Prox RCA to Mid RCA lesion is 100% stenosed.  Acute Marginal Branch Collaterals Acute Mrg filled by collaterals from RV Branch.  Third Right Posterolateral Branch Collaterals 3rd RPL filled by collaterals from 1st Sept.  Intervention  No interventions have  been documented.   STRESS TESTS  MYOCARDIAL PERFUSION IMAGING 07/11/2022  Narrative   Findings are consistent with mild ischemia involving apical and mid portion of the inferior walland no infarction. The study is intermediate risk.   No ST deviation was noted.   Left ventricular function is normal. Nuclear stress EF: 61 %. The left ventricular ejection fraction is normal (55-65%). End diastolic cavity size is normal.   Prior study available for comparison from 12/22/2020.                  Recent Labs: 06/27/2022: ALT 12; TSH 1.330 08/21/2022: BUN 7; Creatinine, Ser 1.11; Hemoglobin 15.6; Platelets 188; Potassium 4.1; Sodium 139  Recent Lipid  Panel    Component Value Date/Time   CHOL 98 (L) 06/27/2022 1508   TRIG 123 06/27/2022 1508   HDL 42 06/27/2022 1508   CHOLHDL 2.3 06/27/2022 1508   CHOLHDL 4.0 12/24/2020 0128   VLDL 51 (H) 12/24/2020 0128   LDLCALC 34 06/27/2022 1508    Physical Exam:    VS:  BP 124/84 (BP Location: Right Arm, Patient Position: Sitting, Cuff Size: Normal)   Pulse 66   Ht 5\' 9"  (1.753 m)   Wt 209 lb 9.6 oz (95.1 kg)   SpO2 93%   BMI 30.95 kg/m     Wt Readings from Last 3 Encounters:  10/25/22 209 lb 9.6 oz (95.1 kg)  08/28/22 215 lb 12.8 oz (97.9 kg)  08/21/22 215 lb 12.8 oz (97.9 kg)     GEN: Looks older than his age well nourished, well developed in no acute distress HEENT: Normal NECK: No JVD; No carotid bruits LYMPHATICS: No lymphadenopathy CARDIAC: I guess he happens RRR, no murmurs, rubs, gallops RESPIRATORY:  Clear to auscultation without rales, wheezing or rhonchi  ABDOMEN: Soft, non-tender, non-distended MUSCULOSKELETAL:  No edema; No deformity  SKIN: Warm and dry NEUROLOGIC:  Alert and oriented x 3 PSYCHIATRIC:  Normal affect    Signed, Norman Herrlich, MD  10/25/2022 4:35 PM    [facility]

## 2022-10-25 ENCOUNTER — Ambulatory Visit: Payer: 59 | Attending: Cardiology | Admitting: Cardiology

## 2022-10-25 ENCOUNTER — Encounter: Payer: Self-pay | Admitting: Cardiology

## 2022-10-25 VITALS — BP 124/84 | HR 66 | Ht 69.0 in | Wt 209.6 lb

## 2022-10-25 DIAGNOSIS — I119 Hypertensive heart disease without heart failure: Secondary | ICD-10-CM | POA: Diagnosis not present

## 2022-10-25 DIAGNOSIS — I25118 Atherosclerotic heart disease of native coronary artery with other forms of angina pectoris: Secondary | ICD-10-CM | POA: Diagnosis not present

## 2022-10-25 DIAGNOSIS — E782 Mixed hyperlipidemia: Secondary | ICD-10-CM | POA: Diagnosis not present

## 2022-10-25 MED ORDER — COLCHICINE 0.6 MG PO TABS: 0.6000 mg | ORAL_TABLET | Freq: Every day | ORAL | 3 refills | Status: DC

## 2022-10-25 MED ORDER — CLOPIDOGREL BISULFATE 75 MG PO TABS: 75.0000 mg | ORAL_TABLET | Freq: Every day | ORAL | 3 refills | Status: DC

## 2022-10-25 NOTE — Addendum Note (Signed)
Addended by: Roxanne Mins I on: 10/25/2022 05:01 PM   Modules accepted: Orders

## 2022-10-25 NOTE — Patient Instructions (Signed)
Medication Instructions:  Your physician has recommended you make the following change in your medication:   START: Clopidogrel 75 mg daily START: Colchicine 0.6 mg daily  *If you need a refill on your cardiac medications before your next appointment, please call your pharmacy*   Lab Work: Your physician recommends that you return for lab work in:   Labs today: Lipids, Apo B, Lpa, HS CRP  If you have labs (blood work) drawn today and your tests are completely normal, you will receive your results only by: MyChart Message (if you have MyChart) OR A paper copy in the mail If you have any lab test that is abnormal or we need to change your treatment, we will call you to review the results.   Testing/Procedures: None   Follow-Up: At [facility], you and your health needs are our priority.  As part of our continuing mission to provide you with exceptional heart care, we have created designated Provider Care Teams.  These Care Teams include your primary Cardiologist (physician) and Advanced Practice Providers (APPs -  Physician Assistants and Nurse Practitioners) who all work together to provide you with the care you need, when you need it.  We recommend signing up for the patient portal called "MyChart".  Sign up information is provided on this After Visit Summary.  MyChart is used to connect with patients for Virtual Visits (Telemedicine).  Patients are able to view lab/test results, encounter notes, upcoming appointments, etc.  Non-urgent messages can be sent to your provider as well.   To learn more about what you can do with MyChart, go to [email or web address].    Your next appointment:   3 month(s)  Provider:   Wallis Bamberg, NP [facility])    Other Instructions None

## 2022-12-13 ENCOUNTER — Other Ambulatory Visit: Payer: Self-pay

## 2023-01-16 NOTE — Progress Notes (Unsigned)
Cardiology Office Note:  .   Date:  01/18/2023  ID:  Jimmy Morrison, DOB 06-Jan-1966, MRN [id number] PCP: Lise Auer, MD  [facility] Providers Cardiologist:  Norman Herrlich, MD    History of Present Illness: .   Jimmy Morrison is a 57 y.o. male with a past medical history of CAD with CTO of proximal RCA and second marginal lesion, hypertension, COPD, OSA, GERD, hyperlipidemia, tobacco use.  12/24/20 multivessel CAD CTO of the proximal RCA with antegrade bridging collaterals as well as retrograde left-to-right collaterals to the distal vessel 12/25/20  echocardiogram EF 55-60%, impaired relaxation, trace MR, mild TR 07/11/22 MPI Findings are consistent with mild ischemia involving apical and mid portion of the inferior walland no infarction. The study is intermediate risk 08/31/2022 LHC previously known CTO of RCA; second marginal lesion 100% stenosed  Most recently evaluated by Dr. Dulce Sellar on 06/27/2022 with complaints of nonexertional chest pain. MPI was arranged and revealed mild ischemia, intermediate risk. Evaluated on 07/28/22, feeling no worse and no better, started Ranexa. Evaluated on 08/21/22 by Dr. Dulce Sellar, no changes in symptoms and decision was made for [facility].  Evaluated 10/25/2022 following his most recent left heart cath, he was stable from that perspective and started on clopidogrel and colchicine.  Discussion was had about possibly sending him to [facility] if his symptoms continue to persist.  He presents today for follow-up of his CAD.  He is actually feeling well, offers no formal complaints, no further episodes of chest pain.  He is bothered by shortness of breath, he feels this is related to his history of heavy smoking and COPD. He denies chest pain, palpitations, dyspnea, pnd, orthopnea, n, v, dizziness, syncope, edema, weight gain, or early satiety.    ROS: Review of Systems  HENT: Negative.    Respiratory:  Positive for cough and shortness of breath.   Gastrointestinal:  Negative.   Genitourinary: Negative.   Musculoskeletal: Negative.   Skin: Negative.   Neurological: Negative.   Endo/Heme/Allergies: Negative.   Psychiatric/Behavioral: Negative.    All other systems reviewed and are negative.    Studies Reviewed: .        Cardiac Studies & Procedures   CARDIAC CATHETERIZATION  CARDIAC CATHETERIZATION 08/31/2022  Narrative   Prox Cx lesion is 20% stenosed.   Mid Cx lesion is 20% stenosed.   Prox RCA to Mid RCA lesion is 100% stenosed.   1st Diag lesion is 50% stenosed.   2nd Mrg lesion is 100% stenosed.  1.  Relatively unchanged burden of coronary artery disease with CTO of proximal right coronary artery collateralized by the left system and CTO small second obtuse marginal with mild disease elsewhere. 2.  LVEDP of 14 mmHg.   Recommendation: Medical management.  The results were reviewed with Dr. Swazil[place] of the [facility] CTO PCI team whose opinion is that the right coronary artery is not amenable to CTO PCI.  Findings Coronary Findings Diagnostic  Dominance: Right  Left Anterior Descending  First Diagonal Branch 1st Diag lesion is 50% stenosed.  Left Circumflex Prox Cx lesion is 20% stenosed. Mid Cx lesion is 20% stenosed.  Second Obtuse Marginal Branch Collaterals 2nd Mrg filled by collaterals from 1st Diag.  2nd Mrg lesion is 100% stenosed.  Right Coronary Artery Prox RCA to Mid RCA lesion is 100% stenosed.  Acute Marginal Branch Collaterals Acute Mrg filled by collaterals from RV Branch.  Third Right Posterolateral Branch Collaterals 3rd RPL filled by collaterals from 1st Sept.  Intervention  No interventions have been documented.   CARDIAC CATHETERIZATION  CARDIAC CATHETERIZATION 12/24/2020  Narrative   1st Diag lesion is 50% stenosed.   Prox RCA to Mid RCA lesion is 100% stenosed.   Prox Cx lesion is 20% stenosed.   Mid Cx lesion is 20% stenosed.  Moderate multivessel CAD with with 50% stenosis in the  first diagonal branch of the LAD with otherwise normal-appearing LAD; nonobstructive 20% stenoses in the circumflex vessel; and chronic appearing total occlusion of the proximal RCA with antegrade bridging collateralization as well as retrograde left to right collateralization to the distal vessel.  Normal LV contractility with EF estimated 50 to 55% without definitive segmental wall motion abnormalities.  LVEDP 10 mmHg.  RECOMMENDATION: Medical therapy.  Patient was [facility]cardic at [facility].  Consider adding isosorbide and/or amlodipine for anti-ischemic benefit.  Aggressive lipid-lowering therapy with target LDL less than 70.  Findings Coronary Findings Diagnostic  Dominance: Right  Left Anterior Descending  First Diagonal Branch 1st Diag lesion is 50% stenosed.  Left Circumflex Prox Cx lesion is 20% stenosed. Mid Cx lesion is 20% stenosed.  Right Coronary Artery Prox RCA to Mid RCA lesion is 100% stenosed.  Acute Marginal Branch Collaterals Acute Mrg filled by collaterals from RV Branch.  Third Right Posterolateral Branch Collaterals 3rd RPL filled by collaterals from 1st Sept.  Intervention  No interventions have been documented.   STRESS TESTS  MYOCARDIAL PERFUSION IMAGING 07/11/2022  Narrative   Findings are consistent with mild ischemia involving apical and mid portion of the inferior walland no infarction. The study is intermediate risk.   No ST deviation was noted.   Left ventricular function is normal. Nuclear stress EF: 61 %. The left ventricular ejection fraction is normal (55-65%). End diastolic cavity size is normal.   Prior study available for comparison from 12/22/2020.              Risk Assessment/Calculations:             Physical Exam:   VS:  BP 120/79 (BP Location: Right Arm, Patient Position: Sitting, Cuff Size: Normal)   Pulse 61   Ht 5\' 9"  (1.753 m)   Wt 210 lb (95.3 kg)   SpO2 94%   BMI 31.01 kg/m    Wt Readings from Last 3  Encounters:  01/18/23 210 lb (95.3 kg)  10/25/22 209 lb 9.6 oz (95.1 kg)  08/28/22 215 lb 12.8 oz (97.9 kg)    GEN: Well nourished, well developed in no acute distress NECK: No JVD; No carotid bruits CARDIAC: RRR, no murmurs, rubs, gallops RESPIRATORY: Fine crackles in the bases of lungs, otherwise clear ABDOMEN: abdomen distended and firm.  EXTREMITIES:  No edema; No deformity   ASSESSMENT AND PLAN: .   CAD-most recent left heart cath earlier this year revealed LHC previously known CTO of RCA; second marginal lesion 100% stenosed. Stable with no anginal symptoms. No indication for ischemic evaluation.  Continue aspirin 81 mg daily, continue Norvasc 2.5 mg daily, continue Plavix 75 mg daily, continue colchicine 0.6 mg daily, continue Zetia 10 mg daily, continue fenofibrate 145 mg daily, continue Vascepa 1 mg daily, continue Ranexa 500 mg twice daily, continue Crestor 40 mg daily, continue nitroglycerin--has not needed.  HFpEF - most recent echocardiogram EF 55-60%, impaired relaxation, trace MR, mild TR. NYHA class I-II, euvolemic.  Continue Demadex 10 mg as needed for pedal edema, continue Diovan 80 mg daily.  Hypertension-blood pressure is well-controlled today at 118/80 continue Norvasc 2.5  mg daily, continue valsartan 80 mg daily.  HLD - will repeat LFTs and FLP today, would prefer his LDL to be less than 55.  Tobacco abuse-currently smoking 1.5 packs/day, states he is cut down some, he is well aware of the deleterious side effects of smoking, she is not a good time for him to stop smoking right now as he was recently evicted.  We discussed frankly that if he changes his mind and he is ready to stop smoking to call our office and we will arrange for Wellbutrin or Chantix to help assist with his smoking.  COPD-heavy smoker, formally was established with pulmonology but has been many years and he cannot recall who the provider was.  Will refer him to [facility] pulmonology.       Dispo:  CMET, CBC, fasting lipid panel today, follow-up with Dr. Dulce Sellar in 3 months.  Signed, Flossie Dibble, NP

## 2023-01-18 ENCOUNTER — Encounter: Payer: Self-pay | Admitting: Cardiology

## 2023-01-18 ENCOUNTER — Ambulatory Visit: Payer: 59 | Attending: Cardiology | Admitting: Cardiology

## 2023-01-18 VITALS — BP 120/79 | HR 61 | Ht 69.0 in | Wt 210.0 lb

## 2023-01-18 DIAGNOSIS — E782 Mixed hyperlipidemia: Secondary | ICD-10-CM | POA: Diagnosis not present

## 2023-01-18 DIAGNOSIS — Z720 Tobacco use: Secondary | ICD-10-CM

## 2023-01-18 DIAGNOSIS — J449 Chronic obstructive pulmonary disease, unspecified: Secondary | ICD-10-CM | POA: Diagnosis not present

## 2023-01-18 DIAGNOSIS — I2510 Atherosclerotic heart disease of native coronary artery without angina pectoris: Secondary | ICD-10-CM

## 2023-01-18 DIAGNOSIS — I10 Essential (primary) hypertension: Secondary | ICD-10-CM

## 2023-01-18 NOTE — Patient Instructions (Addendum)
Medication Instructions:  Your physician recommends that you continue on your current medications as directed. Please refer to the Current Medication list given to you today.  *If you need a refill on your cardiac medications before your next appointment, please call your pharmacy*   Lab Work: Your physician recommends that you have labs done in the office today. Your test included  complete metabolic panel, complete blood count and lipids.   If you have labs (blood work) drawn today and your tests are completely normal, you will receive your results only by: MyChart Message (if you have MyChart) OR A paper copy in the mail If you have any lab test that is abnormal or we need to change your treatment, we will call you to review the results.   Testing/Procedures: None Ordered   Follow-Up: At [facility], you and your health needs are our priority.  As part of our continuing mission to provide you with exceptional heart care, we have created designated Provider Care Teams.  These Care Teams include your primary Cardiologist (physician) and Advanced Practice Providers (APPs -  Physician Assistants and Nurse Practitioners) who all work together to provide you with the care you need, when you need it.  We recommend signing up for the patient portal called "MyChart".  Sign up information is provided on this After Visit Summary.  MyChart is used to connect with patients for Virtual Visits (Telemedicine).  Patients are able to view lab/test results, encounter notes, upcoming appointments, etc.  Non-urgent messages can be sent to your provider as well.   To learn more about what you can do with MyChart, go to [email or web address].    Your next appointment:   3 month follow up with Dr. Dulce Sellar

## 2023-01-19 ENCOUNTER — Telehealth: Payer: Self-pay | Admitting: Emergency Medicine

## 2023-01-19 LAB — LIPID PANEL
Chol/HDL Ratio: 2.4 ratio (ref 0.0–5.0)
Cholesterol, Total: 102 mg/dL (ref 100–199)
HDL: 43 mg/dL
LDL Chol Calc (NIH): 38 mg/dL (ref 0–99)
Triglycerides: 119 mg/dL (ref 0–149)
VLDL Cholesterol Cal: 21 mg/dL (ref 5–40)

## 2023-01-19 LAB — COMPREHENSIVE METABOLIC PANEL WITH GFR
ALT: 8 IU/L (ref 0–44)
AST: 14 IU/L (ref 0–40)
Albumin: 4.2 g/dL (ref 3.8–4.9)
Alkaline Phosphatase: 58 IU/L (ref 44–121)
BUN/Creatinine Ratio: 8 — ABNORMAL LOW (ref 9–20)
BUN: 9 mg/dL (ref 6–24)
Bilirubin Total: 0.3 mg/dL (ref 0.0–1.2)
CO2: 21 mmol/L (ref 20–29)
Calcium: 9.4 mg/dL (ref 8.7–10.2)
Chloride: 104 mmol/L (ref 96–106)
Creatinine, Ser: 1.19 mg/dL (ref 0.76–1.27)
Globulin, Total: 2.3 g/dL (ref 1.5–4.5)
Glucose: 87 mg/dL (ref 70–99)
Potassium: 4 mmol/L (ref 3.5–5.2)
Sodium: 140 mmol/L (ref 134–144)
Total Protein: 6.5 g/dL (ref 6.0–8.5)
eGFR: 71 mL/min/1.73

## 2023-01-19 LAB — CBC
Hematocrit: 45 % (ref 37.5–51.0)
Hemoglobin: 15.4 g/dL (ref 13.0–17.7)
MCH: 32.2 pg (ref 26.6–33.0)
MCHC: 34.2 g/dL (ref 31.5–35.7)
MCV: 94 fL (ref 79–97)
Platelets: 176 x10E3/uL (ref 150–450)
RBC: 4.79 x10E6/uL (ref 4.14–5.80)
RDW: 13.2 % (ref 11.6–15.4)
WBC: 7.6 x10E3/uL (ref 3.4–10.8)

## 2023-01-19 NOTE — Telephone Encounter (Signed)
-----   Message from Flossie Dibble sent at 01/19/2023  1:13 PM EST ----- Kidney function is stable, electrolytes are normal.  CBC without anemia or infection.  Cholesterol is perfect.

## 2023-01-19 NOTE — Telephone Encounter (Signed)
Called patient and left a voicemail 

## 2023-01-22 NOTE — Telephone Encounter (Signed)
Results reviewed with pt as per Wallis Bamberg NP's note.  Pt verbalized understanding and had no additional questions. Routed to PCP.

## 2023-03-15 DIAGNOSIS — Z0000 Encounter for general adult medical examination without abnormal findings: Secondary | ICD-10-CM | POA: Diagnosis not present

## 2023-03-15 DIAGNOSIS — M25561 Pain in right knee: Secondary | ICD-10-CM | POA: Diagnosis not present

## 2023-04-13 ENCOUNTER — Encounter: Payer: Self-pay | Admitting: Pulmonary Disease

## 2023-04-13 ENCOUNTER — Institutional Professional Consult (permissible substitution): Payer: 59 | Admitting: Pulmonary Disease

## 2023-04-13 NOTE — Progress Notes (Deleted)
 Synopsis: Referred in *** for ***  Subjective:   PATIENT ID: Jimmy Morrison GENDER: male DOB: 23-Oct-1965, MRN: [id number]   HPI  No chief complaint on file.   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 ICD-10 cut over Formatting of this note might be different from the original. 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   Obesity (BMI 30.0-34.9) 01/22/2012   OSA (obstructive sleep apnea)    Pain syndrome, chronic 02/22/2012   Postlaminectomy syndrome, lumbar region 01/07/2009   Radiculopathy of cervical spine 05/12/2020   Right arm pain 04/22/2020   S/P cervical spinal fusion 05/28/2020   Tobacco use 12/24/2020   Ulnar neuropathy at elbow of right upper extremity 10/26/2020     Family History  Problem Relation Age of Onset   Hypertension Mother    Hypertension Sister    Hypertension Brother    Heart attack Maternal Aunt      Social History   Socioeconomic History   Marital status: Married    Spouse name: Not on file   Number of children: Not on file   Years of education: Not on file   Highest education level: Not on file  Occupational History   Not on file  Tobacco Use   Smoking status:  Every Day    Current packs/day: 1.00    Average packs/day: 1 pack/day for 35.0 years (35.0 ttl pk-yrs)    Types: Cigarettes   Smokeless tobacco: Never  Vaping Use   Vaping status: Never Used  Substance and Sexual Activity   Alcohol use: Not Currently   Drug use: Not Currently   Sexual activity: Not on file  Other Topics Concern   Not on file  Social History Narrative   Not on file   Social Drivers of Health   Financial Resource Strain: Not on file  Food Insecurity: Food Insecurity Present (03/04/2020)   Hunger Vital Sign    Worried About Running Out of Food in the Last Year: Sometimes true    Ran Out of Food in the Last Year: Sometimes true  Transportation Needs: No Transportation Needs (03/04/2020)   PRAPARE - Administrator, Civil Service (Medical): No    Lack of Transportation (Non-Medical): No  Physical Activity: Not on file  Stress: Not on file  Social Connections: Not on file  Intimate Partner Violence: Not on file     Allergies  Allergen Reactions   Imdur [Isosorbide Nitrate] Other (See Comments)    Tongue swelling    Lyrica [Pregabalin]     Bumps in mouth   Gabapentin Swelling    Tongue swelling   Isosorbide Other (See Comments)    Swollen tongue/ headaches   Prednisone  Mood changes   Wellbutrin [Bupropion]     Bumps in mouth     Outpatient Medications Prior to Visit  Medication Sig Dispense Refill   acetaminophen (TYLENOL) 650 MG CR tablet Take 1,300 mg by mouth every 8 (eight) hours as needed for pain.     albuterol (VENTOLIN HFA) 108 (90 Base) MCG/ACT inhaler Inhale 2 puffs into the lungs 2 (two) times daily.     amLODipine (NORVASC) 2.5 MG tablet Take 2.5 mg by mouth daily.     aspirin EC 81 MG tablet Take 81 mg by mouth at bedtime. Swallow whole.     busPIRone (BUSPAR) 5 MG tablet Take 5 mg by mouth 3 (three) times daily as needed (anxiety).     clopidogrel (PLAVIX) 75 MG tablet Take 1 tablet (75 mg total) by mouth daily. 90 tablet 3    colchicine 0.6 MG tablet Take 1 tablet (0.6 mg total) by mouth daily. 90 tablet 3   DULoxetine (CYMBALTA) 60 MG capsule Take 60 mg by mouth every morning.     ezetimibe (ZETIA) 10 MG tablet Take 10 mg by mouth at bedtime.     fenofibrate (TRICOR) 145 MG tablet Take 145 mg by mouth at bedtime.     fluticasone (FLONASE) 50 MCG/ACT nasal spray Place 1-2 sprays into both nostrils daily as needed for allergies or rhinitis.     ipratropium-albuterol (DUONEB) 0.5-2.5 (3) MG/3ML SOLN Inhale 3 mLs into the lungs daily.     magnesium oxide (MAG-OX) 400 MG tablet Take 400 mg by mouth daily.     montelukast (SINGULAIR) 10 MG tablet Take 10 mg by mouth every morning.     naloxone (NARCAN) nasal spray 4 mg/0.1 mL Place 1 spray into the nose Once PRN.     nitroGLYCERIN (NITROSTAT) 0.4 MG SL tablet Place 1 tablet (0.4 mg total) under the tongue every 5 (five) minutes x 3 doses as needed for chest pain. 25 tablet 1   omeprazole (PRILOSEC) 40 MG capsule Take 40 mg by mouth 2 (two) times daily.     ondansetron (ZOFRAN-ODT) 4 MG disintegrating tablet Take 4 mg by mouth every 6 (six) hours as needed for nausea.     oxyCODONE (OXY IR/ROXICODONE) 5 MG immediate release tablet Take 5 mg by mouth every 6 (six) hours as needed for moderate pain or severe pain.     ranolazine (RANEXA) 500 MG 12 hr tablet Take 1 tablet (500 mg total) by mouth 2 (two) times daily. 180 tablet 3   rosuvastatin (CRESTOR) 40 MG tablet Take 1 tablet (40 mg total) by mouth daily at 6 PM. 90 tablet 2   STIOLTO RESPIMAT 2.5-2.5 MCG/ACT AERS Inhale 1 puff into the lungs daily.     tiZANidine (ZANAFLEX) 2 MG tablet Take 2 mg by mouth at bedtime.     tiZANidine (ZANAFLEX) 4 MG tablet Take 4 mg by mouth every 8 (eight) hours as needed.     torsemide (DEMADEX) 10 MG tablet Take 1 tablet (10 mg total) by mouth daily as needed (Swelling and fluid retention). 90 tablet 3   traZODone (DESYREL) 100 MG tablet Take 100-200 mg by mouth at bedtime.     valsartan  (DIOVAN) 80 MG tablet Take 80 mg by mouth every morning.     VASCEPA 1 g capsule Take 2 g by mouth 2 (two) times daily.     vitamin B-12 (CYANOCOBALAMIN) 1000 MCG tablet Take 1,000 mcg by mouth daily.     No facility-administered medications prior  to visit.    ROS    Objective:  There were no vitals filed for this visit.   Physical Exam    CBC    Component Value Date/Time   WBC 7.6 01/18/2023 1617   WBC 7.4 12/24/2020 0128   RBC 4.79 01/18/2023 1617   RBC 4.59 12/24/2020 0128   HGB 15.4 01/18/2023 1617   HCT 45.0 01/18/2023 1617   PLT 176 01/18/2023 1617   MCV 94 01/18/2023 1617   MCH 32.2 01/18/2023 1617   MCH 33.1 12/24/2020 0128   MCHC 34.2 01/18/2023 1617   MCHC 36.0 12/24/2020 0128   RDW 13.2 01/18/2023 1617     Chest imaging:  PFT:     No data to display          Labs:  Path:  Echo:  Heart Catheterization:       Assessment & Plan:   No diagnosis found.  Discussion: ***    Current Outpatient Medications:    acetaminophen (TYLENOL) 650 MG CR tablet, Take 1,300 mg by mouth every 8 (eight) hours as needed for pain., Disp: , Rfl:    albuterol (VENTOLIN HFA) 108 (90 Base) MCG/ACT inhaler, Inhale 2 puffs into the lungs 2 (two) times daily., Disp: , Rfl:    amLODipine (NORVASC) 2.5 MG tablet, Take 2.5 mg by mouth daily., Disp: , Rfl:    aspirin EC 81 MG tablet, Take 81 mg by mouth at bedtime. Swallow whole., Disp: , Rfl:    busPIRone (BUSPAR) 5 MG tablet, Take 5 mg by mouth 3 (three) times daily as needed (anxiety)., Disp: , Rfl:    clopidogrel (PLAVIX) 75 MG tablet, Take 1 tablet (75 mg total) by mouth daily., Disp: 90 tablet, Rfl: 3   colchicine 0.6 MG tablet, Take 1 tablet (0.6 mg total) by mouth daily., Disp: 90 tablet, Rfl: 3   DULoxetine (CYMBALTA) 60 MG capsule, Take 60 mg by mouth every morning., Disp: , Rfl:    ezetimibe (ZETIA) 10 MG tablet, Take 10 mg by mouth at bedtime., Disp: , Rfl:    fenofibrate (TRICOR) 145 MG tablet, Take  145 mg by mouth at bedtime., Disp: , Rfl:    fluticasone (FLONASE) 50 MCG/ACT nasal spray, Place 1-2 sprays into both nostrils daily as needed for allergies or rhinitis., Disp: , Rfl:    ipratropium-albuterol (DUONEB) 0.5-2.5 (3) MG/3ML SOLN, Inhale 3 mLs into the lungs daily., Disp: , Rfl:    magnesium oxide (MAG-OX) 400 MG tablet, Take 400 mg by mouth daily., Disp: , Rfl:    montelukast (SINGULAIR) 10 MG tablet, Take 10 mg by mouth every morning., Disp: , Rfl:    naloxone (NARCAN) nasal spray 4 mg/0.1 mL, Place 1 spray into the nose Once PRN., Disp: , Rfl:    nitroGLYCERIN (NITROSTAT) 0.4 MG SL tablet, Place 1 tablet (0.4 mg total) under the tongue every 5 (five) minutes x 3 doses as needed for chest pain., Disp: 25 tablet, Rfl: 1   omeprazole (PRILOSEC) 40 MG capsule, Take 40 mg by mouth 2 (two) times daily., Disp: , Rfl:    ondansetron (ZOFRAN-ODT) 4 MG disintegrating tablet, Take 4 mg by mouth every 6 (six) hours as needed for nausea., Disp: , Rfl:    oxyCODONE (OXY IR/ROXICODONE) 5 MG immediate release tablet, Take 5 mg by mouth every 6 (six) hours as needed for moderate pain or severe pain., Disp: , Rfl:    ranolazine (RANEXA) 500 MG 12 hr tablet, Take 1 tablet (500 mg total) by  mouth 2 (two) times daily., Disp: 180 tablet, Rfl: 3   rosuvastatin (CRESTOR) 40 MG tablet, Take 1 tablet (40 mg total) by mouth daily at 6 PM., Disp: 90 tablet, Rfl: 2   STIOLTO RESPIMAT 2.5-2.5 MCG/ACT AERS, Inhale 1 puff into the lungs daily., Disp: , Rfl:    tiZANidine (ZANAFLEX) 2 MG tablet, Take 2 mg by mouth at bedtime., Disp: , Rfl:    tiZANidine (ZANAFLEX) 4 MG tablet, Take 4 mg by mouth every 8 (eight) hours as needed., Disp: , Rfl:    torsemide (DEMADEX) 10 MG tablet, Take 1 tablet (10 mg total) by mouth daily as needed (Swelling and fluid retention)., Disp: 90 tablet, Rfl: 3   traZODone (DESYREL) 100 MG tablet, Take 100-200 mg by mouth at bedtime., Disp: , Rfl:    valsartan (DIOVAN) 80 MG tablet, Take 80  mg by mouth every morning., Disp: , Rfl:    VASCEPA 1 g capsule, Take 2 g by mouth 2 (two) times daily., Disp: , Rfl:    vitamin B-12 (CYANOCOBALAMIN) 1000 MCG tablet, Take 1,000 mcg by mouth daily., Disp: , Rfl:

## 2023-04-30 ENCOUNTER — Ambulatory Visit: Payer: 59 | Admitting: Cardiology

## 2023-05-02 ENCOUNTER — Ambulatory Visit

## 2023-05-02 VITALS — BP 128/84 | HR 58 | Ht 69.0 in | Wt 217.2 lb

## 2023-05-02 DIAGNOSIS — E782 Mixed hyperlipidemia: Secondary | ICD-10-CM | POA: Diagnosis not present

## 2023-05-02 DIAGNOSIS — I10 Essential (primary) hypertension: Secondary | ICD-10-CM | POA: Diagnosis not present

## 2023-05-02 DIAGNOSIS — I25118 Atherosclerotic heart disease of native coronary artery with other forms of angina pectoris: Secondary | ICD-10-CM | POA: Diagnosis not present

## 2023-05-02 NOTE — Patient Instructions (Signed)

## 2023-05-02 NOTE — Progress Notes (Signed)
 Cardiology Consultation:    Date:  05/02/2023   ID:  Jimmy Morrison, DOB 08/09/1965, MRN [id number]  PCP:  Jimmy Auer, MD  Cardiologist:  Jimmy Corporal Belicia Difatta, MD   Referring MD: Jimmy Auer, MD   No chief complaint on file.    ASSESSMENT AND PLAN:   Jimmy Morrison 58 year old male with history of CAD with known CTO of proximal RCA with distal collaterals and left-to-right collaterals on cardiac cath November 2022, hypertension, COPD, obstructive sleep apnea, GERD, hyperlipidemia, smoking tobacco, most recently stress test from June 2024 showed mid to apical inferior segment ischemia, subsequently underwent coronary angiogram August 2024 and noted no significant changes from prior Cath:  CTO of the proximal RCA once again observed, along with CTO of small obtuse marginal branch noted.  LVEDP normal 14.  Lesion was felt to be not amenable for PCI and continued on medical therapy.  Follows up with Jimmy Morrison and last visit was with Jimmy Morrison.  Here for routine follow-up.  Overall doing well at his baseline.  Problem List Items Addressed This Visit     Coronary artery disease - Primary   Extensive coronary artery disease history. Ischemia on recent stress test with nuclear imaging June 2024 mid to apical inferior segments. Redo cath August 2024 once again demonstrated CTO of the proximal RCA similar to prior And newly occluded small obtuse marginal branch, with distal collaterals to both vessels. LVEDP was also normal at 14. His prior echocardiogram is from November 2022 that noted normal LV function EF 55 to 60% grade 1 diastolic dysfunction, trace Jimmy and mild TR with RVSP normal 22 mmHg.  Clinically symptoms of shortness of breath with exertion.  No significant change over the past 6 months. No clinical signs or symptoms of heart failure.  In this context his symptoms of dyspnea on exertion reflect either stable angina versus underlying pulmonary causes. He has evaluation  pending with pulmonologist and encouraged him to keep the appointment. If no significant pulmonary causes are identified, will consider reevaluating with a repeat echocardiogram to assess for any significant LV dysfunction or wall motion abnormality.  He is on dual antiplatelet therapy aspirin 81 mg once daily and will started on clopidogrel 75 mg once daily after his cath given his complex CAD anatomy as per Dr. Hulen Morrison recommendation, tolerating it well. Also on colchicine 0.6 mg daily for anti-inflammatory effect.  He is on excellent antianginal regimen with amlodipine 2.5 mg once daily Ranexa 500 mg twice daily. Not on beta-blockers due to history of COPD and slow resting heart rate in 50s to 60s. Reported allergic reaction to Imdur in the past [tongue swelling].  Has sublingual nitroglycerin to use, but has never  used it.  He was empirically placed on torsemide loop diuretic to be used as needed for any swelling in the ankles suggesting fluid retention.  He has not been using this regularly.  No pedal edema.  As noted above LVEDP normal, previous BNP level from May 2024 was normal 10      Hypertension   Well-controlled on current regimen with amlodipine 2.5 mg once daily Valsartan 80 mg once daily. Continue the same. Target below 130/80 mmHg.        Hyperlipidemia   Excellent control of his lipid levels with December 2024 lab measurement showing LDL 38, HDL 43, total cholesterol 409.  Triglycerides 119.  He is on good regimen with Zetia 10 mg once daily Fenofibrate 145 mg at bedtime Rosuvastatin  40 mg once daily. Vascepa 2 g twice daily. Continue the same.       Return to clinic tentatively in 1 year.  Earlier follow-up as needed.   History of Present Illness:    Jimmy Morrison is a 58 y.o. male who is being seen today for follow-up visit. Last visit with Jimmy Bamberg, Morrison at our office was 01-18-2023. PCP is Jimmy Auer, MD.  History of CAD with CTO of  proximal RCA with distal collaterals and left-to-right collaterals on cardiac cath November 2022, hypertension, COPD, obstructive sleep apnea, GERD, hyperlipidemia, smoking tobacco, most recently stress test from June 2024 showed mid to apical inferior segment ischemia, subsequently underwent coronary angiogram August 2024 and noted no significant changes from prior Cath:  CTO of the proximal RCA once again observed, along with CTO of small obtuse marginal branch noted.  LVEDP normal 14.  Lesion was felt to be not amenable for PCI and continued on medical therapy.  Pleasant gentleman here for the visit by himself.  Lives at home with his wife and his daughter. Is currently disabled due to chronic back pain and neck pain issues limiting his mobility.  More recently with his right knee instability, mobility is further limited and he has been using a brace.  No regular exercise at home.  However mentions he has been at his baseline.  Denies any chest pain.  Does get short winded at times with exertion, but reports no significant change over the last 6 months.  He is pending evaluation with pulmonologist Dr. Francine Morrison at [facility] May 15th. Denies any need to use sublingual nitroglycerin.  He carries it with them but has not used that ever.  He denies any pedal edema, orthopnea, paroxysmal nocturnal dyspnea. Denies any palpitations, blood in urine or stools. Denies any significant weight changes.  Taking his current prescribed medications consistently and feels Ranexa has helped.  Continues to smoke up to a pack a day.  Mentions he is not yet ready to quit smoking.  Echocardiogram from November 2022 at [facility] noted LVEF 55 to 60%, grade 1 diastolic dysfunction, trace mitral regurgitation, mild tricuspid regurgitation, RVSP estimated 22 mmHg.  Lipid panel from December 2024 LDL 38, HDL 43, total cholesterol 644. CMP from 01-18-2023 BUN 9 creatinine 1.19.  eGFR 71. Normal  transaminases and alkaline phosphatase. CBC with normal hemoglobin 15.4, hematocrit 45.  Platelets 176.  WBC 7.6.    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 ICD-10 cut over Formatting of this note might be different from the original. 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   Obesity (BMI 30.0-34.9) 01/22/2012   OSA (obstructive sleep apnea)    Pain syndrome, chronic 02/22/2012  Postlaminectomy syndrome, lumbar region 01/07/2009   Radiculopathy of cervical spine 05/12/2020   Right arm pain 04/22/2020   S/P cervical spinal fusion 05/28/2020   Tobacco use 12/24/2020   Ulnar neuropathy at elbow of right upper extremity 10/26/2020    Past Surgical History:  Procedure Laterality Date   ANTERIOR CERVICAL DECOMP/DISCECTOMY FUSION N/A 05/12/2020   Procedure: Anterior Cervical Decompression Fusion - Cervical five-Cervical six, removal of Cervical six-seven plate;  Surgeon: Bedelia Person, MD;  Location: [facility] OR;  Service: Neurosurgery;  Laterality: N/A;   ANTERIOR FUSION CERVICAL SPINE     BACK SURGERY     CARPAL TUNNEL RELEASE Right    COLONOSCOPY W/ POLYPECTOMY     LEFT HEART CATH AND CORONARY ANGIOGRAPHY N/A  12/24/2020   Procedure: LEFT HEART CATH AND CORONARY ANGIOGRAPHY;  Surgeon: Lennette Bihari, MD;  Location: [facility] INVASIVE CV LAB;  Service: Cardiovascular;  Laterality: N/A;   LEFT HEART CATH AND CORONARY ANGIOGRAPHY N/A 08/31/2022   Procedure: LEFT HEART CATH AND CORONARY ANGIOGRAPHY;  Surgeon: Orbie Pyo, MD;  Location: [facility] INVAS[facility] CV LAB;  Service: Cardiovascular;  Laterality: N/A;   LUMBAR NERVE STIMLATOR INSERTION  10/2010   And Cervical nerve stimulator   SHOULDER ARTHROSCOPY Left    cleaned up after accident    Current Medications: Current Meds  Medication Sig   acetaminophen (TYLENOL) 650 MG CR tablet Take 1,300 mg by mouth every 8 (eight) hours as needed for pain.   albuterol (VENTOLIN HFA) 108 (90 Base) MCG/ACT inhaler Inhale 2 puffs into the lungs 2 (two) times daily.   amLODipine (NORVASC) 2.5 MG tablet Take 2.5 mg by mouth daily.   aspirin EC 81 MG tablet Take 81 mg by mouth at bedtime. Swallow whole.   busPIRone (BUSPAR) 5 MG tablet Take 5 mg by mouth 3 (three) times daily as needed (anxiety).   clopidogrel (PLAVIX) 75 MG tablet Take 1 tablet (75 mg total) by mouth daily.   colchicine 0.6 MG tablet Take 1 tablet (0.6 mg total) by mouth daily.   cyclobenzaprine (FLEXERIL) 10 MG tablet Take 10 mg by mouth at bedtime.   DULoxetine (CYMBALTA) 60 MG capsule Take 60 mg by mouth every morning.   ezetimibe (ZETIA) 10 MG tablet Take 10 mg by mouth at bedtime.   fenofibrate (TRICOR) 145 MG tablet Take 145 mg by mouth at bedtime.   fluticasone (FLONASE) 50 MCG/ACT nasal spray Place 1-2 sprays into both nostrils daily as needed for allergies or rhinitis.   ipratropium-albuterol (DUONEB) 0.5-2.5 (3) MG/3ML SOLN Inhale 3 mLs into the lungs daily.   magnesium oxide (MAG-OX) 400 MG tablet Take 400 mg by mouth daily.   montelukast (SINGULAIR) 10 MG tablet Take 10 mg by mouth every morning.   naloxone (NARCAN) nasal spray 4 mg/0.1 mL Place 1 spray into the nose Once PRN.   nitroGLYCERIN  (NITROSTAT) 0.4 MG SL tablet Place 1 tablet (0.4 mg total) under the tongue every 5 (five) minutes x 3 doses as needed for chest pain.   omeprazole (PRILOSEC) 40 MG capsule Take 40 mg by mouth 2 (two) times daily.   ondansetron (ZOFRAN-ODT) 4 MG disintegrating tablet Take 4 mg by mouth every 6 (six) hours as needed for nausea.   oxyCODONE (OXY IR/ROXICODONE) 5 MG immediate release tablet Take 5 mg by mouth every 6 (six) hours as needed for moderate pain or severe pain.   ranolazine (RANEXA) 500 MG 12 hr tablet Take 1 tablet (500 mg total) by mouth 2 (two)  times daily.   rosuvastatin (CRESTOR) 40 MG tablet Take 1 tablet (40 mg total) by mouth daily at 6 PM.   STIOLTO RESPIMAT 2.5-2.5 MCG/ACT AERS Inhale 1 puff into the lungs daily.   tiZANidine (ZANAFLEX) 2 MG tablet Take 2 mg by mouth at bedtime.   tiZANidine (ZANAFLEX) 4 MG tablet Take 4 mg by mouth every 8 (eight) hours as needed.   torsemide (DEMADEX) 10 MG tablet Take 1 tablet (10 mg total) by mouth daily as needed (Swelling and fluid retention).   traZODone (DESYREL) 100 MG tablet Take 100-200 mg by mouth at bedtime.   valsartan (DIOVAN) 80 MG tablet Take 80 mg by mouth every morning.   VASCEPA 1 g capsule Take 2 g by mouth 2 (two) times daily.   vitamin B-12 (CYANOCOBALAMIN) 1000 MCG tablet Take 1,000 mcg by mouth daily.     Allergies:   Imdur [isosorbide nitrate], Lyrica [pregabalin], Gabapentin, Isosorbide, Prednisone, and Wellbutrin [bupropion]   Social History   Socioeconomic History   Marital status: Married    Spouse name: Not on file   Number of children: Not on file   Years of education: Not on file   Highest education level: Not on file  Occupational History   Not on file  Tobacco Use   Smoking status: Every Day    Current packs/day: 1.00    Average packs/day: 1 pack/day for 35.0 years (35.0 ttl pk-yrs)    Types: Cigarettes   Smokeless tobacco: Never  Vaping Use   Vaping status: Never Used  Substance and Sexual  Activity   Alcohol use: Not Currently   Drug use: Not Currently   Sexual activity: Not on file  Other Topics Concern   Not on file  Social History Narrative   Not on file   Social Drivers of Health   Financial Resource Strain: Not on file  Food Insecurity: Food Insecurity Present (03/04/2020)   Hunger Vital Sign    Worried About Running Out of Food in the Last Year: Sometimes true    Ran Out of Food in the Last Year: Sometimes true  Transportation Needs: No Transportation Needs (03/04/2020)   PRAPARE - Administrator, Civil Service (Medical): No    Lack of Transportation (Non-Medical): No  Physical Activity: Not on file  Stress: Not on file  Social Connections: Not on file     Family History: The patient's family history includes Heart attack in his maternal aunt; Hypertension in his brother, mother, and sister. ROS:   Please see the history of present illness.    All 14 point review of systems negative except as described per history of present illness.  EKGs/Labs/Other Studies Reviewed:    The following studies were reviewed today:   EKG:       Recent Labs: 06/27/2022: TSH 1.330 01/18/2023: ALT 8; BUN 9; Creatinine, Ser 1.19; Hemoglobin 15.4; Platelets 176; Potassium 4.0; Sodium 140  Recent Lipid Panel    Component Value Date/Time   CHOL 102 01/18/2023 1617   TRIG 119 01/18/2023 1617   HDL 43 01/18/2023 1617   CHOLHDL 2.4 01/18/2023 1617   CHOLHDL 4.0 12/24/2020 0128   VLDL 51 (H) 12/24/2020 0128   LDLCALC 38 01/18/2023 1617    Physical Exam:    VS:  BP 128/84   Pulse (!) 58   Ht 5\' 9"  (1.753 m)   Wt 217 lb 3.2 oz (98.5 kg)   SpO2 95%   BMI 32.07 kg/m  Wt Readings from Last 3 Encounters:  05/02/23 217 lb 3.2 oz (98.5 kg)  01/18/23 210 lb (95.3 kg)  10/25/22 209 lb 9.6 oz (95.1 kg)     GENERAL:  Well nourished, well developed in no acute distress NECK: No JVD; No carotid bruits CARDIAC: RRR, S1 and S2 present, no murmurs, no rubs, no  gallops CHEST: Mild bilateral diffuse rhonchi.    Extremities: No pitting pedal edema. Pulses bilaterally symmetric with radial 2+ and dorsalis pedis 2+ NEUROLOGIC:  Alert and oriented x 3  Medication Adjustments/Labs and Tests Ordered: Current medicines are reviewed at length with the patient today.  Concerns regarding medicines are outlined above.  No orders of the defined types were placed in this encounter.  No orders of the defined types were placed in this encounter.   Signed, Cecille Amsterdam, MD, MPH, [facility]. 05/02/2023 7:03 PM    [facility]

## 2023-05-02 NOTE — Assessment & Plan Note (Addendum)
 Extensive coronary artery disease history. Ischemia on recent stress test with nuclear imaging June 2024 mid to apical inferior segments. Redo cath August 2024 once again demonstrated CTO of the proximal RCA similar to prior And newly occluded small obtuse marginal branch, with distal collaterals to both vessels. LVEDP was also normal at 14. His prior echocardiogram is from November 2022 that noted normal LV function EF 55 to 60% grade 1 diastolic dysfunction, trace MR and mild TR with RVSP normal 22 mmHg.  Clinically symptoms of shortness of breath with exertion.  No significant change over the past 6 months. No clinical signs or symptoms of heart failure.  In this context his symptoms of dyspnea on exertion reflect either stable angina versus underlying pulmonary causes. He has evaluation pending with pulmonologist and encouraged him to keep the appointment. If no significant pulmonary causes are identified, will consider reevaluating with a repeat echocardiogram to assess for any significant LV dysfunction or wall motion abnormality.  He is on dual antiplatelet therapy aspirin 81 mg once daily and will started on clopidogrel 75 mg once daily after his cath given his complex CAD anatomy as per Dr. Hulen Shouts recommendation, tolerating it well. Also on colchicine 0.6 mg daily for anti-inflammatory effect.  He is on excellent antianginal regimen with amlodipine 2.5 mg once daily Ranexa 500 mg twice daily. Not on beta-blockers due to history of COPD and slow resting heart rate in 50s to 60s. Reported allergic reaction to Imdur in the past [tongue swelling].  Has sublingual nitroglycerin to use, but has never  used it.  He was empirically placed on torsemide loop diuretic to be used as needed for any swelling in the ankles suggesting fluid retention.  He has not been using this regularly.  No pedal edema.  As noted above LVEDP normal, previous BNP level from May 2024 was normal 10

## 2023-05-02 NOTE — Assessment & Plan Note (Signed)
 Excellent control of his lipid levels with December 2024 lab measurement showing LDL 38, HDL 43, total cholesterol 098.  Triglycerides 119.  He is on good regimen with Zetia 10 mg once daily Fenofibrate 145 mg at bedtime Rosuvastatin 40 mg once daily. Vascepa 2 g twice daily. Continue the same.

## 2023-05-02 NOTE — Assessment & Plan Note (Addendum)
 Well-controlled on current regimen with amlodipine 2.5 mg once daily Valsartan 80 mg once daily. Continue the same. Target below 130/80 mmHg.

## 2023-06-14 ENCOUNTER — Ambulatory Visit: Admitting: Pulmonary Disease

## 2023-06-14 ENCOUNTER — Encounter: Payer: Self-pay | Admitting: Pulmonary Disease

## 2023-06-14 VITALS — BP 120/83 | HR 66 | Ht 69.0 in | Wt 216.0 lb

## 2023-06-14 DIAGNOSIS — J449 Chronic obstructive pulmonary disease, unspecified: Secondary | ICD-10-CM | POA: Diagnosis not present

## 2023-06-14 DIAGNOSIS — F17210 Nicotine dependence, cigarettes, uncomplicated: Secondary | ICD-10-CM | POA: Diagnosis not present

## 2023-06-14 DIAGNOSIS — R0683 Snoring: Secondary | ICD-10-CM | POA: Diagnosis not present

## 2023-06-14 MED ORDER — TRELEGY ELLIPTA 200-62.5-25 MCG/ACT IN AEPB
1.0000 | INHALATION_SPRAY | Freq: Every day | RESPIRATORY_TRACT | 11 refills | Status: DC
Start: 2023-06-14 — End: 2023-12-13

## 2023-06-14 MED ORDER — TRELEGY ELLIPTA 200-62.5-25 MCG/ACT IN AEPB: INHALATION_SPRAY | RESPIRATORY_TRACT | Status: DC

## 2023-06-14 NOTE — Progress Notes (Signed)
 Synopsis: Referred in May 2025 for COPD  Subjective:   PATIENT ID: Jimmy Morrison GENDER: male DOB: 1965-03-09, MRN: [id number]   HPI  Chief Complaint  Patient presents with   Consult    Pt states cardiologist wanted him to be seen pain in lungs mostly at night ,CC all coughs so hard makes him dizzy   Jimmy Morrison is a 58 year old male, daily smoker with history of CAD, GERD, and hypertension who is referred to pulmonary clinic for COPD.   He experiences frequent wheezing and a dry cough that disrupts sleep, along with difficulty breathing when lying down. Episodes resembling anxiety attacks occur, but no chest pain is present. Snoring and apnea episodes are noted, though a previous sleep study indicated no sleep apnea.  He has a significant smoking history, currently smoking about a pack and a half per day since age 51. He has attempted to quit but finds it challenging.  He lives in a rental property with mold and water damage for the past four years, which may contribute to his symptoms.  His current medications include Stiolto and albuterol  inhalers. He has not used Breztri or Trelegy inhalers before. The green inhaler induces coughing, but he continues its use.   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  ICD-10 cut over Formatting of this note might be different from the original. 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   Obesity (BMI 30.0-34.9) 01/22/2012   OSA (obstructive sleep apnea)    Pain syndrome, chronic 02/22/2012   Postlaminectomy syndrome, lumbar region 01/07/2009   Radiculopathy of cervical spine 05/12/2020   Right arm pain 04/22/2020   S/P cervical spinal fusion 05/28/2020   Tobacco use 12/24/2020   Ulnar neuropathy at elbow of right upper extremity 10/26/2020     Family History  Problem Relation Age of Onset   Hypertension Mother    Hypertension Sister    Hypertension Brother    Heart attack Maternal Aunt      Social History   Socioeconomic History   Marital status: Married    Spouse name: Not on file   Number of children: Not on file   Years of education: Not on file   Highest education level: Not on file  Occupational History   Not on file  Tobacco Use   Smoking status: Every Day    Current packs/day: 1.00    Average packs/day: 1 pack/day for 35.0 years (35.0 ttl pk-yrs)    Types: Cigarettes   Smokeless tobacco: Never  Vaping Use   Vaping status: Never Used  Substance and Sexual Activity   Alcohol use: Not Currently   Drug use: Not Currently   Sexual activity: Not on file  Other Topics Concern   Not on file  Social History Narrative   Not on file   Social Drivers of Health   Financial Resource Strain: Not on file  Food Insecurity: Food Insecurity Present (03/04/2020)   Hunger Vital Sign    Worried About Running Out of Food in the Last Year: Sometimes true    Ran Out of Food in the Last Year: Sometimes true  Transportation Needs: No Transportation Needs (03/04/2020)   PRAPARE - Administrator, Civil Service (Medical): No    Lack of Transportation (Non-Medical): No  Physical Activity: Not on file  Stress: Not on file  Social  Connections: Not on file  Intimate Partner Violence: Not on file     Allergies  Allergen Reactions   Imdur  [Isosorbide  Nitrate] Other (See Comments)    Tongue swelling    Lyrica [Pregabalin]     Bumps in mouth   Gabapentin Swelling    Tongue swelling   Isosorbide  Other (See Comments)    Swollen tongue/ headaches   Prednisone     Mood changes   Wellbutrin [Bupropion]     Bumps in mouth     Outpatient Medications Prior to Visit  Medication Sig Dispense Refill   acetaminophen  (TYLENOL ) 650 MG CR tablet Take 1,300 mg by mouth every 8 (eight) hours as needed for pain.     albuterol  (VENTOLIN  HFA) 108 (90 Base) MCG/ACT inhaler Inhale 2 puffs into the lungs 2 (two) times daily.     amLODipine  (NORVASC ) 2.5 MG tablet Take 2.5 mg by mouth daily.     aspirin  EC 81 MG tablet Take 81 mg by mouth at bedtime. Swallow whole.     busPIRone (BUSPAR) 5 MG tablet Take 5 mg by mouth 3 (three) times daily as needed (anxiety).     clopidogrel  (PLAVIX ) 75 MG tablet Take 1 tablet (75 mg total) by mouth daily. 90 tablet 3   colchicine  0.6 MG tablet Take 1 tablet (0.6 mg total) by mouth daily. 90 tablet 3   cyclobenzaprine (FLEXERIL) 10 MG tablet Take 10 mg by mouth at bedtime.     DULoxetine  (CYMBALTA ) 60 MG capsule Take 60 mg by mouth every morning.     ezetimibe  (ZETIA ) 10 MG tablet Take 10 mg by mouth at bedtime.     fenofibrate  (TRICOR ) 145 MG tablet Take 145 mg by mouth at bedtime.     fluticasone  (FLONASE ) 50 MCG/ACT nasal spray Place 1-2 sprays into both nostrils daily as needed for allergies or rhinitis.     ipratropium-albuterol  (DUONEB) 0.5-2.5 (3) MG/3ML SOLN Inhale 3 mLs into the lungs daily.     magnesium oxide (MAG-OX) 400 MG tablet Take 400 mg by mouth daily.     montelukast  (SINGULAIR ) 10 MG tablet Take 10 mg by mouth every morning.     naloxone (NARCAN) nasal spray 4 mg/0.1 mL Place 1 spray into the nose Once PRN.     nitroGLYCERIN  (NITROSTAT ) 0.4 MG SL tablet Place 1 tablet (0.4 mg  total) under the tongue every 5 (five) minutes x 3 doses as needed for chest pain. 25 tablet 1   omeprazole (PRILOSEC) 40 MG capsule Take 40 mg by mouth 2 (two) times daily.     ondansetron  (ZOFRAN -ODT) 4 MG disintegrating tablet Take 4 mg by mouth every 6 (six) hours as needed for nausea.     oxyCODONE  (OXY IR/ROXICODONE ) 5 MG immediate release tablet Take 5 mg by mouth every 6 (six) hours as needed for moderate  pain or severe pain.     ranolazine  (RANEXA ) 500 MG 12 hr tablet Take 1 tablet (500 mg total) by mouth 2 (two) times daily. 180 tablet 3   rosuvastatin  (CRESTOR ) 40 MG tablet Take 1 tablet (40 mg total) by mouth daily at 6 PM. 90 tablet 2   tiZANidine  (ZANAFLEX ) 2 MG tablet Take 2 mg by mouth at bedtime.     tiZANidine  (ZANAFLEX ) 4 MG tablet Take 4 mg by mouth every 8 (eight) hours as needed.     torsemide  (DEMADEX ) 10 MG tablet Take 1 tablet (10 mg total) by mouth daily as needed (Swelling and fluid retention). 90 tablet 3   traZODone  (DESYREL ) 100 MG tablet Take 100-200 mg by mouth at bedtime.     valsartan (DIOVAN) 80 MG tablet Take 80 mg by mouth every morning.     VASCEPA  1 g capsule Take 2 g by mouth 2 (two) times daily.     vitamin B-12 (CYANOCOBALAMIN) 1000 MCG tablet Take 1,000 mcg by mouth daily.     STIOLTO RESPIMAT 2.5-2.5 MCG/ACT AERS Inhale 1 puff into the lungs daily.     No facility-administered medications prior to visit.   Review of Systems  Constitutional:  Negative for chills, fever, malaise/fatigue and weight loss.  HENT:  Negative for congestion, sinus pain and sore throat.   Eyes: Negative.   Respiratory:  Positive for cough, shortness of breath and wheezing. Negative for hemoptysis and sputum production.   Cardiovascular:  Negative for chest pain, palpitations, orthopnea, claudication and leg swelling.  Gastrointestinal:  Negative for abdominal pain, heartburn, nausea and vomiting.  Genitourinary: Negative.   Musculoskeletal:  Negative for joint pain and  myalgias.  Skin:  Negative for rash.  Neurological:  Negative for weakness.  Endo/Heme/Allergies: Negative.   Psychiatric/Behavioral: Negative.      Objective:   Vitals:   06/14/23 1358  BP: 120/83  Pulse: 66  SpO2: 95%  Weight: 216 lb (98 kg)  Height: 5\' 9"  (1.753 m)     Physical Exam Constitutional:      General: He is not in acute distress.    Appearance: Normal appearance.  Eyes:     General: No scleral icterus.    Conjunctiva/sclera: Conjunctivae normal.  Cardiovascular:     Rate and Rhythm: Normal rate and regular rhythm.  Pulmonary:     Breath sounds: Decreased air movement present. No wheezing, rhonchi or rales.  Musculoskeletal:     Right lower leg: No edema.     Left lower leg: No edema.  Skin:    General: Skin is warm and dry.  Neurological:     General: No focal deficit present.       CBC    Component Value Date/Time   WBC 7.6 01/18/2023 1617   WBC 7.4 12/24/2020 0128   RBC 4.79 01/18/2023 1617   RBC 4.59 12/24/2020 0128   HGB 15.4 01/18/2023 1617   HCT 45.0 01/18/2023 1617   PLT 176 01/18/2023 1617   MCV 94 01/18/2023 1617   MCH 32.2 01/18/2023 1617   MCH 33.1 12/24/2020 0128   MCHC 34.2 01/18/2023 1617   MCHC 36.0 12/24/2020 0128   RDW 13.2 01/18/2023 1617     Chest imaging:  PFT:     No data to display          Labs:  Path:  Echo:  Heart Catheterization:       Assessment & Plan:   Chronic obstructive pulmonary disease, unspecified COPD type (HCC) -  Plan: Fluticasone -Umeclidin-Vilant (TRELEGY ELLIPTA) 200-62.5-25 MCG/ACT AEPB  Cigarette smoker - Plan: Ambulatory Referral for Lung Cancer Scre  Snoring - Plan: Home sleep test  Discussion: Coleston Sarff is a 58 year old male, daily smoker with history of CAD, GERD, and hypertension who is referred to pulmonary clinic for COPD.   Chronic Obstructive Pulmonary Disease (COPD) COPD with cough, wheezing, and dyspnea. Current inhalers: Stiolto and albuterol . Stiolto  may be effective but causes coughing. Considering Trelegy for its steroid component with fewer mood-related side effects than prednisone. - Order pulmonary function tests. - Prescribe Trelegy inhaler; discontinue Stiolto upon initiation. - Advise to contact clinic for respiratory exacerbations to prevent hospital or urgent care visits.  Nicotine dependence, cigarettes Nicotine dependence with 1.5 packs/day smoking since age 41.  - Implement smoking cessation strategies with hospital patches, gum, or lozenges. - discussed smoking cessation options for 3 minutes  Sleep apnea (suspected) Suspected sleep apnea with snoring and apneic episodes.  - Order home sleep study.  Lung cancer screening Eligible for lung cancer screening due to smoking history. Discussed increased cancer risk and early detection via CT scans. Annual scans if negative; earlier follow-up if nodules detected. - Refer to lung cancer screening program for CT scan.  Follow up in 3 months with PFTs.  Duaine German, MD [facility] Office: [phone]    Current Outpatient Medications:    acetaminophen  (TYLENOL ) 650 MG CR tablet, Take 1,300 mg by mouth every 8 (eight) hours as needed for pain., Disp: , Rfl:    albuterol  (VENTOLIN  HFA) 108 (90 Base) MCG/ACT inhaler, Inhale 2 puffs into the lungs 2 (two) times daily., Disp: , Rfl:    amLODipine  (NORVASC ) 2.5 MG tablet, Take 2.5 mg by mouth daily., Disp: , Rfl:    aspirin  EC 81 MG tablet, Take 81 mg by mouth at bedtime. Swallow whole., Disp: , Rfl:    busPIRone (BUSPAR) 5 MG tablet, Take 5 mg by mouth 3 (three) times daily as needed (anxiety)., Disp: , Rfl:    clopidogrel  (PLAVIX ) 75 MG tablet, Take 1 tablet (75 mg total) by mouth daily., Disp: 90 tablet, Rfl: 3   colchicine  0.6 MG tablet, Take 1 tablet (0.6 mg total) by mouth daily., Disp: 90 tablet, Rfl: 3   cyclobenzaprine (FLEXERIL) 10 MG tablet, Take 10 mg by mouth at bedtime., Disp: , Rfl:     DULoxetine  (CYMBALTA ) 60 MG capsule, Take 60 mg by mouth every morning., Disp: , Rfl:    ezetimibe  (ZETIA ) 10 MG tablet, Take 10 mg by mouth at bedtime., Disp: , Rfl:    fenofibrate  (TRICOR ) 145 MG tablet, Take 145 mg by mouth at bedtime., Disp: , Rfl:    fluticasone  (FLONASE ) 50 MCG/ACT nasal spray, Place 1-2 sprays into both nostrils daily as needed for allergies or rhinitis., Disp: , Rfl:    Fluticasone -Umeclidin-Vilant (TRELEGY ELLIPTA) 200-62.5-25 MCG/ACT AEPB, Inhale 1 puff into the lungs daily., Disp: 28 each, Rfl: 11   ipratropium-albuterol  (DUONEB) 0.5-2.5 (3) MG/3ML SOLN, Inhale 3 mLs into the lungs daily., Disp: , Rfl:    magnesium oxide (MAG-OX) 400 MG tablet, Take 400 mg by mouth daily., Disp: , Rfl:    montelukast  (SINGULAIR ) 10 MG tablet, Take 10 mg by mouth every morning., Disp: , Rfl:    naloxone (NARCAN) nasal spray 4 mg/0.1 mL, Place 1 spray into the nose Once PRN., Disp: , Rfl:    nitroGLYCERIN  (NITROSTAT ) 0.4 MG SL tablet, Place 1 tablet (0.4 mg total) under the tongue every  5 (five) minutes x 3 doses as needed for chest pain., Disp: 25 tablet, Rfl: 1   omeprazole (PRILOSEC) 40 MG capsule, Take 40 mg by mouth 2 (two) times daily., Disp: , Rfl:    ondansetron  (ZOFRAN -ODT) 4 MG disintegrating tablet, Take 4 mg by mouth every 6 (six) hours as needed for nausea., Disp: , Rfl:    oxyCODONE  (OXY IR/ROXICODONE ) 5 MG immediate release tablet, Take 5 mg by mouth every 6 (six) hours as needed for moderate pain or severe pain., Disp: , Rfl:    ranolazine  (RANEXA ) 500 MG 12 hr tablet, Take 1 tablet (500 mg total) by mouth 2 (two) times daily., Disp: 180 tablet, Rfl: 3   rosuvastatin  (CRESTOR ) 40 MG tablet, Take 1 tablet (40 mg total) by mouth daily at 6 PM., Disp: 90 tablet, Rfl: 2   tiZANidine  (ZANAFLEX ) 2 MG tablet, Take 2 mg by mouth at bedtime., Disp: , Rfl:    tiZANidine  (ZANAFLEX ) 4 MG tablet, Take 4 mg by mouth every 8 (eight) hours as needed., Disp: , Rfl:    torsemide  (DEMADEX ) 10  MG tablet, Take 1 tablet (10 mg total) by mouth daily as needed (Swelling and fluid retention)., Disp: 90 tablet, Rfl: 3   traZODone  (DESYREL ) 100 MG tablet, Take 100-200 mg by mouth at bedtime., Disp: , Rfl:    valsartan (DIOVAN) 80 MG tablet, Take 80 mg by mouth every morning., Disp: , Rfl:    VASCEPA  1 g capsule, Take 2 g by mouth 2 (two) times daily., Disp: , Rfl:    vitamin B-12 (CYANOCOBALAMIN) 1000 MCG tablet, Take 1,000 mcg by mouth daily., Disp: , Rfl:

## 2023-06-14 NOTE — Patient Instructions (Addendum)
 We will refer you to our lung cancer screening team to be scheduled for a CT Chest scan  Recommend using 21mg  nicotine patches daily  Use mini nicotine lozenges 2mg  as needed for break through cravings  Call 1-800-quit-NOW to get free nicotine replacement and counseling from the state of [place]      Start trelegy ellipta 1 puff daily - rinse mouth out after each use  Stop stiolto when you start the trelegy  Continue albuterol  inhaler as needed  We will schedule you for pulmonary function tests at follow up in 3 months  We will schedule you for home sleep study  Follow up in 3 months

## 2023-06-14 NOTE — Addendum Note (Signed)
 Addended by: Luana Rumple R on: 06/14/2023 03:44 PM   Modules accepted: Orders

## 2023-06-15 ENCOUNTER — Other Ambulatory Visit: Payer: Self-pay | Admitting: Cardiology

## 2023-06-18 ENCOUNTER — Other Ambulatory Visit (HOSPITAL_COMMUNITY): Payer: Self-pay

## 2023-07-23 DIAGNOSIS — M25561 Pain in right knee: Secondary | ICD-10-CM | POA: Diagnosis not present

## 2023-08-07 DIAGNOSIS — M1711 Unilateral primary osteoarthritis, right knee: Secondary | ICD-10-CM | POA: Diagnosis not present

## 2023-08-07 DIAGNOSIS — M1712 Unilateral primary osteoarthritis, left knee: Secondary | ICD-10-CM | POA: Diagnosis not present

## 2023-08-20 ENCOUNTER — Other Ambulatory Visit: Payer: Self-pay | Admitting: Cardiology

## 2023-08-22 ENCOUNTER — Encounter: Payer: Self-pay | Admitting: Emergency Medicine

## 2023-09-10 DIAGNOSIS — H6122 Impacted cerumen, left ear: Secondary | ICD-10-CM | POA: Diagnosis not present

## 2023-09-10 DIAGNOSIS — M961 Postlaminectomy syndrome, not elsewhere classified: Secondary | ICD-10-CM | POA: Diagnosis not present

## 2023-09-20 ENCOUNTER — Telehealth: Payer: Self-pay

## 2023-09-20 ENCOUNTER — Ambulatory Visit: Admitting: Pulmonary Disease

## 2023-09-20 DIAGNOSIS — J449 Chronic obstructive pulmonary disease, unspecified: Secondary | ICD-10-CM

## 2023-09-20 NOTE — Telephone Encounter (Signed)
 VM/ Lm x2 to cancel appointment, no orders HST or pft  ever done.

## 2023-09-20 NOTE — Progress Notes (Deleted)
 Synopsis: Referred in May 2025 for COPD  Subjective:   PATIENT ID: Jimmy Morrison GENDER: male DOB: 11-23-1965, MRN: [id number]   HPI  No chief complaint on file.  Jimmy Morrison is a 58 year old male, daily smoker with history of CAD, GERD, and hypertension who is referred to pulmonary clinic for COPD.   He experiences frequent wheezing and a dry cough that disrupts sleep, along with difficulty breathing when lying down. Episodes resembling anxiety attacks occur, but no chest pain is present. Snoring and apnea episodes are noted, though a previous sleep study indicated no sleep apnea.  He has a significant smoking history, currently smoking about a pack and a half per day since age 46. He has attempted to quit but finds it challenging.  He lives in a rental property with mold and water damage for the past four years, which may contribute to his symptoms.  His current medications include Stiolto and albuterol  inhalers. He has not used Breztri or Trelegy inhalers before. The green inhaler induces coughing, but he continues its use.   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 ICD-10 cut over Formatting of this note might be different from the original. 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   Obesity (BMI 30.0-34.9) 01/22/2012   OSA (obstructive sleep apnea)    Pain syndrome, chronic 02/22/2012   Postlaminectomy syndrome, lumbar region 01/07/2009   Radiculopathy of cervical spine 05/12/2020   Right arm pain 04/22/2020   S/P cervical spinal fusion 05/28/2020   Tobacco use 12/24/2020   Ulnar neuropathy at elbow of right upper extremity 10/26/2020     Family History  Problem Relation Age of Onset   Hypertension Mother    Hypertension Sister    Hypertension Brother    Heart attack Maternal Aunt      Social History   Socioeconomic History   Marital status: Married    Spouse name: Not on file   Number of children: Not on file   Years of education: Not on file   Highest education level: Not on file  Occupational History   Not on file  Tobacco Use   Smoking status: Every Day    Current packs/day: 1.00    Average packs/day: 1 pack/day for 35.0 years (35.0 ttl pk-yrs)    Types: Cigarettes   Smokeless tobacco: Never  Vaping Use   Vaping status: Never Used  Substance and Sexual Activity   Alcohol use: Not Currently   Drug use: Not Currently   Sexual activity: Not on file  Other Topics Concern   Not on file  Social History Narrative   Not on file   Social Drivers of Health   Financial Resource Strain: Not  on file  Food Insecurity: Food Insecurity Present (03/04/2020)   Hunger Vital Sign    Worried About Running Out of Food in the Last Year: Sometimes true    Ran Out of Food in the Last Year: Sometimes true  Transportation Needs: No Transportation Needs (03/04/2020)   PRAPARE - Administrator, Civil Service (Medical): No    Lack of Transportation (Non-Medical): No  Physical Activity: Not on file  Stress: Not on file  Social Connections: Not on file  Intimate Partner Violence: Not on file     Allergies  Allergen Reactions   Imdur  [Isosorbide  Nitrate] Other  (See Comments)    Tongue swelling    Lyrica [Pregabalin]     Bumps in mouth   Gabapentin Swelling    Tongue swelling   Isosorbide  Other (See Comments)    Swollen tongue/ headaches   Prednisone     Mood changes   Wellbutrin [Bupropion]     Bumps in mouth     Outpatient Medications Prior to Visit  Medication Sig Dispense Refill   acetaminophen  (TYLENOL ) 650 MG CR tablet Take 1,300 mg by mouth every 8 (eight) hours as needed for pain.     albuterol  (VENTOLIN  HFA) 108 (90 Base) MCG/ACT inhaler Inhale 2 puffs into the lungs 2 (two) times daily.     amLODipine  (NORVASC ) 2.5 MG tablet Take 2.5 mg by mouth daily.     aspirin  EC 81 MG tablet Take 81 mg by mouth at bedtime. Swallow whole.     busPIRone (BUSPAR) 5 MG tablet Take 5 mg by mouth 3 (three) times daily as needed (anxiety).     clopidogrel  (PLAVIX ) 75 MG tablet Take 1 tablet (75 mg total) by mouth daily. 90 tablet 3   colchicine  0.6 MG tablet Take 1 tablet (0.6 mg total) by mouth daily. 90 tablet 3   cyclobenzaprine (FLEXERIL) 10 MG tablet Take 10 mg by mouth at bedtime.     DULoxetine  (CYMBALTA ) 60 MG capsule Take 60 mg by mouth every morning.     ezetimibe  (ZETIA ) 10 MG tablet Take 10 mg by mouth at bedtime.     fenofibrate  (TRICOR ) 145 MG tablet Take 145 mg by mouth at bedtime.     fluticasone  (FLONASE ) 50 MCG/ACT nasal spray Place 1-2 sprays into both nostrils daily as needed for allergies or rhinitis.     Fluticasone -Umeclidin-Vilant (TRELEGY ELLIPTA ) 200-62.5-25 MCG/ACT AEPB Inhale 1 puff into the lungs daily. 28 each 11   Fluticasone -Umeclidin-Vilant (TRELEGY ELLIPTA ) 200-62.5-25 MCG/ACT AEPB 2 samples     ipratropium-albuterol  (DUONEB) 0.5-2.5 (3) MG/3ML SOLN Inhale 3 mLs into the lungs daily.     magnesium oxide (MAG-OX) 400 MG tablet Take 400 mg by mouth daily.     montelukast  (SINGULAIR ) 10 MG tablet Take 10 mg by mouth every morning.     naloxone (NARCAN) nasal spray 4 mg/0.1 mL Place 1 spray into the nose Once PRN.      nitroGLYCERIN  (NITROSTAT ) 0.4 MG SL tablet Place 1 tablet (0.4 mg total) under the tongue every 5 (five) minutes x 3 doses as needed for chest pain. 25 tablet 1   omeprazole (PRILOSEC) 40 MG capsule Take 40 mg by mouth 2 (two) times daily.     ondansetron  (ZOFRAN -ODT) 4 MG disintegrating tablet Take 4 mg by mouth every 6 (six) hours as needed for nausea.     oxyCODONE  (OXY IR/ROXICODONE ) 5 MG immediate release tablet Take 5 mg by mouth every 6 (six) hours as needed for  moderate pain or severe pain.     ranolazine  (RANEXA ) 500 MG 12 hr tablet Take 1 tablet (500 mg total) by mouth 2 (two) times daily. 180 tablet 2   rosuvastatin  (CRESTOR ) 40 MG tablet Take 1 tablet (40 mg total) by mouth daily at 6 PM. 90 tablet 2   tiZANidine  (ZANAFLEX ) 2 MG tablet Take 2 mg by mouth at bedtime.     tiZANidine  (ZANAFLEX ) 4 MG tablet Take 4 mg by mouth every 8 (eight) hours as needed.     torsemide  (DEMADEX ) 10 MG tablet TAKE 1 TABLET(10 MG) BY MOUTH DAILY AS NEEDED FOR SWELLING OR FLUID RETENTION 90 tablet 3   traZODone  (DESYREL ) 100 MG tablet Take 100-200 mg by mouth at bedtime.     valsartan (DIOVAN) 80 MG tablet Take 80 mg by mouth every morning.     VASCEPA  1 g capsule Take 2 g by mouth 2 (two) times daily.     vitamin B-12 (CYANOCOBALAMIN) 1000 MCG tablet Take 1,000 mcg by mouth daily.     No facility-administered medications prior to visit.   Review of Systems  Constitutional:  Negative for chills, fever, malaise/fatigue and weight loss.  HENT:  Negative for congestion, sinus pain and sore throat.   Eyes: Negative.   Respiratory:  Positive for cough, shortness of breath and wheezing. Negative for hemoptysis and sputum production.   Cardiovascular:  Negative for chest pain, palpitations, orthopnea, claudication and leg swelling.  Gastrointestinal:  Negative for abdominal pain, heartburn, nausea and vomiting.  Genitourinary: Negative.   Musculoskeletal:  Negative for joint pain and myalgias.  Skin:   Negative for rash.  Neurological:  Negative for weakness.  Endo/Heme/Allergies: Negative.   Psychiatric/Behavioral: Negative.      Objective:   There were no vitals filed for this visit.    Physical Exam Constitutional:      General: He is not in acute distress.    Appearance: Normal appearance.  Eyes:     General: No scleral icterus.    Conjunctiva/sclera: Conjunctivae normal.  Cardiovascular:     Rate and Rhythm: Normal rate and regular rhythm.  Pulmonary:     Breath sounds: Decreased air movement present. No wheezing, rhonchi or rales.  Musculoskeletal:     Right lower leg: No edema.     Left lower leg: No edema.  Skin:    General: Skin is warm and dry.  Neurological:     General: No focal deficit present.       CBC    Component Value Date/Time   WBC 7.6 01/18/2023 1617   WBC 7.4 12/24/2020 0128   RBC 4.79 01/18/2023 1617   RBC 4.59 12/24/2020 0128   HGB 15.4 01/18/2023 1617   HCT 45.0 01/18/2023 1617   PLT 176 01/18/2023 1617   MCV 94 01/18/2023 1617   MCH 32.2 01/18/2023 1617   MCH 33.1 12/24/2020 0128   MCHC 34.2 01/18/2023 1617   MCHC 36.0 12/24/2020 0128   RDW 13.2 01/18/2023 1617     Chest imaging:  PFT:     No data to display          Labs:  Path:  Echo:  Heart Catheterization:       Assessment & Plan:   Chronic obstructive pulmonary disease, unspecified COPD type (HCC)  Discussion: Jimmy Morrison is a 58 year old male, daily smoker with history of CAD, GERD, and hypertension who is referred to pulmonary clinic for COPD.   Chronic Obstructive Pulmonary Disease (COPD) COPD  with cough, wheezing, and dyspnea. Current inhalers: Stiolto and albuterol . Stiolto may be effective but causes coughing. Considering Trelegy for its steroid component with fewer mood-related side effects than prednisone. - Order pulmonary function tests. - Prescribe Trelegy inhaler; discontinue Stiolto upon initiation. - Advise to contact clinic for  respiratory exacerbations to prevent hospital or urgent care visits.  Nicotine dependence, cigarettes Nicotine dependence with 1.5 packs/day smoking since age 72.  - Implement smoking cessation strategies with hospital patches, gum, or lozenges. - discussed smoking cessation options for 3 minutes  Sleep apnea (suspected) Suspected sleep apnea with snoring and apneic episodes.  - Order home sleep study.  Lung cancer screening Eligible for lung cancer screening due to smoking history. Discussed increased cancer risk and early detection via CT scans. Annual scans if negative; earlier follow-up if nodules detected. - Refer to lung cancer screening program for CT scan.  Follow up in 3 months with PFTs.  Dorn Chill, MD [facility] Office: [phone]    Current Outpatient Medications:    acetaminophen  (TYLENOL ) 650 MG CR tablet, Take 1,300 mg by mouth every 8 (eight) hours as needed for pain., Disp: , Rfl:    albuterol  (VENTOLIN  HFA) 108 (90 Base) MCG/ACT inhaler, Inhale 2 puffs into the lungs 2 (two) times daily., Disp: , Rfl:    amLODipine  (NORVASC ) 2.5 MG tablet, Take 2.5 mg by mouth daily., Disp: , Rfl:    aspirin  EC 81 MG tablet, Take 81 mg by mouth at bedtime. Swallow whole., Disp: , Rfl:    busPIRone (BUSPAR) 5 MG tablet, Take 5 mg by mouth 3 (three) times daily as needed (anxiety)., Disp: , Rfl:    clopidogrel  (PLAVIX ) 75 MG tablet, Take 1 tablet (75 mg total) by mouth daily., Disp: 90 tablet, Rfl: 3   colchicine  0.6 MG tablet, Take 1 tablet (0.6 mg total) by mouth daily., Disp: 90 tablet, Rfl: 3   cyclobenzaprine (FLEXERIL) 10 MG tablet, Take 10 mg by mouth at bedtime., Disp: , Rfl:    DULoxetine  (CYMBALTA ) 60 MG capsule, Take 60 mg by mouth every morning., Disp: , Rfl:    ezetimibe  (ZETIA ) 10 MG tablet, Take 10 mg by mouth at bedtime., Disp: , Rfl:    fenofibrate  (TRICOR ) 145 MG tablet, Take 145 mg by mouth at bedtime., Disp: , Rfl:    fluticasone   (FLONASE ) 50 MCG/ACT nasal spray, Place 1-2 sprays into both nostrils daily as needed for allergies or rhinitis., Disp: , Rfl:    Fluticasone -Umeclidin-Vilant (TRELEGY ELLIPTA ) 200-62.5-25 MCG/ACT AEPB, Inhale 1 puff into the lungs daily., Disp: 28 each, Rfl: 11   Fluticasone -Umeclidin-Vilant (TRELEGY ELLIPTA ) 200-62.5-25 MCG/ACT AEPB, 2 samples, Disp: , Rfl:    ipratropium-albuterol  (DUONEB) 0.5-2.5 (3) MG/3ML SOLN, Inhale 3 mLs into the lungs daily., Disp: , Rfl:    magnesium oxide (MAG-OX) 400 MG tablet, Take 400 mg by mouth daily., Disp: , Rfl:    montelukast  (SINGULAIR ) 10 MG tablet, Take 10 mg by mouth every morning., Disp: , Rfl:    naloxone (NARCAN) nasal spray 4 mg/0.1 mL, Place 1 spray into the nose Once PRN., Disp: , Rfl:    nitroGLYCERIN  (NITROSTAT ) 0.4 MG SL tablet, Place 1 tablet (0.4 mg total) under the tongue every 5 (five) minutes x 3 doses as needed for chest pain., Disp: 25 tablet, Rfl: 1   omeprazole (PRILOSEC) 40 MG capsule, Take 40 mg by mouth 2 (two) times daily., Disp: , Rfl:    ondansetron  (ZOFRAN -ODT) 4 MG disintegrating tablet, Take 4  mg by mouth every 6 (six) hours as needed for nausea., Disp: , Rfl:    oxyCODONE  (OXY IR/ROXICODONE ) 5 MG immediate release tablet, Take 5 mg by mouth every 6 (six) hours as needed for moderate pain or severe pain., Disp: , Rfl:    ranolazine  (RANEXA ) 500 MG 12 hr tablet, Take 1 tablet (500 mg total) by mouth 2 (two) times daily., Disp: 180 tablet, Rfl: 2   rosuvastatin  (CRESTOR ) 40 MG tablet, Take 1 tablet (40 mg total) by mouth daily at 6 PM., Disp: 90 tablet, Rfl: 2   tiZANidine  (ZANAFLEX ) 2 MG tablet, Take 2 mg by mouth at bedtime., Disp: , Rfl:    tiZANidine  (ZANAFLEX ) 4 MG tablet, Take 4 mg by mouth every 8 (eight) hours as needed., Disp: , Rfl:    torsemide  (DEMADEX ) 10 MG tablet, TAKE 1 TABLET(10 MG) BY MOUTH DAILY AS NEEDED FOR SWELLING OR FLUID RETENTION, Disp: 90 tablet, Rfl: 3   traZODone  (DESYREL ) 100 MG tablet, Take 100-200 mg by  mouth at bedtime., Disp: , Rfl:    valsartan (DIOVAN) 80 MG tablet, Take 80 mg by mouth every morning., Disp: , Rfl:    VASCEPA  1 g capsule, Take 2 g by mouth 2 (two) times daily., Disp: , Rfl:    vitamin B-12 (CYANOCOBALAMIN) 1000 MCG tablet, Take 1,000 mcg by mouth daily., Disp: , Rfl:

## 2023-11-10 ENCOUNTER — Other Ambulatory Visit: Payer: Self-pay | Admitting: Cardiology

## 2023-11-16 DIAGNOSIS — M1712 Unilateral primary osteoarthritis, left knee: Secondary | ICD-10-CM | POA: Diagnosis not present

## 2023-11-16 DIAGNOSIS — M1711 Unilateral primary osteoarthritis, right knee: Secondary | ICD-10-CM | POA: Diagnosis not present

## 2023-11-22 ENCOUNTER — Telehealth: Payer: Self-pay

## 2023-11-22 NOTE — Telephone Encounter (Signed)
   Name: Jimmy Morrison  DOB: 07-Aug-1965  MRN: [id number]  Primary Cardiologist: Redell Leiter, MD   Preoperative team, please contact this patient and set up a phone call appointment for further preoperative risk assessment. Please obtain consent and complete medication review. Thank you for your help.  I confirm that guidance regarding antiplatelet and oral anticoagulation therapy has been completed and, if necessary, noted below.  Per office protocol, if patient is without any new symptoms or concerns at the time of their virtual visit, he/she may hold Plavix  for 5 days prior to procedure. Please resume Plavix  as soon as possible postprocedure, at the discretion of the surgeon. We recommend patient continue Aspirin  81 mg daily throughout the perioperative period.    I also confirmed the patient resides in the state of [place] . As per [facility] telemedicine laws, the patient must reside in the state in which the provider is licensed.   Damien JAYSON Braver, NP 11/22/2023, 12:51 PM [facility]

## 2023-11-22 NOTE — Telephone Encounter (Signed)
   Pre-operative Risk Assessment    Patient Name: Jimmy Morrison  DOB: 29-Dec-1965 MRN: [id number]   Date of last office visit: 05/02/23 Date of next office visit: N/A   Request for Surgical Clearance    Procedure:  Right total knee arthoplasty   Date of Surgery:  Clearance TBD                                Surgeon:  Dr. Arley Dark Surgeon's Group or Practice Name:  [facility]  Phone number:  [phone] Fax number:  [phone]   Type of Clearance Requested:   - Pharmacy:  Hold Clopidogrel  (Plavix ) 5-7 days prior to surgery   Type of Anesthesia:  General    Additional requests/questions:    Bonney Calvert Pouch   11/22/2023, 9:30 AM

## 2023-11-22 NOTE — Telephone Encounter (Signed)
 Left message to call back to schedule tele pre op appt.

## 2023-11-23 NOTE — Telephone Encounter (Signed)
 2nd attempt contacting patient to scheduled TELEVISIT no answer left a detailed vm to call back

## 2023-11-26 NOTE — Telephone Encounter (Signed)
 Pt is returning phone call to f/u please advise

## 2023-11-26 NOTE — Telephone Encounter (Signed)
 Pt called back see notes.   Called pt back and left vm to call preop.

## 2023-11-26 NOTE — Telephone Encounter (Signed)
 3rd attempt to reach the pt to schedule tele preop appt. I will update the surgeon office pt needs to call to schedule tele preop appt.

## 2023-11-27 ENCOUNTER — Telehealth (HOSPITAL_BASED_OUTPATIENT_CLINIC_OR_DEPARTMENT_OTHER): Payer: Self-pay | Admitting: *Deleted

## 2023-11-27 NOTE — Telephone Encounter (Signed)
 Pt has been scheduled tele preop appt 12/11/23. Med rec and consent are done.

## 2023-11-27 NOTE — Telephone Encounter (Signed)
 Pt has been scheduled tele preop appt 12/11/23. Med rec and consent are done.     Patient Consent for Virtual Visit        Jimmy Morrison has provided verbal consent on 11/27/2023 for a virtual visit (video or telephone).   CONSENT FOR VIRTUAL VISIT FOR:  Jimmy Morrison  By participating in this virtual visit I agree to the following:  I hereby voluntarily request, consent and authorize [facility] and its employed or contracted physicians, physician assistants, nurse practitioners or other licensed health care professionals (the Practitioner), to provide me with telemedicine health care services (the "Services) as deemed necessary by the treating Practitioner. I acknowledge and consent to receive the Services by the Practitioner via telemedicine. I understand that the telemedicine visit will involve communicating with the Practitioner through live audiovisual communication technology and the disclosure of certain medical information by electronic transmission. I acknowledge that I have been given the opportunity to request an in-person assessment or other available alternative prior to the telemedicine visit and am voluntarily participating in the telemedicine visit.  I understand that I have the right to withhold or withdraw my consent to the use of telemedicine in the course of my care at any time, without affecting my right to future care or treatment, and that the Practitioner or I may terminate the telemedicine visit at any time. I understand that I have the right to inspect all information obtained and/or recorded in the course of the telemedicine visit and may receive copies of available information for a reasonable fee.  I understand that some of the potential risks of receiving the Services via telemedicine include:  Delay or interruption in medical evaluation due to technological equipment failure or disruption; Information transmitted may not be sufficient (e.g. poor  resolution of images) to allow for appropriate medical decision making by the Practitioner; and/or  In rare instances, security protocols could fail, causing a breach of personal health information.  Furthermore, I acknowledge that it is my responsibility to provide information about my medical history, conditions and care that is complete and accurate to the best of my ability. I acknowledge that Practitioner's advice, recommendations, and/or decision may be based on factors not within their control, such as incomplete or inaccurate data provided by me or distortions of diagnostic images or specimens that may result from electronic transmissions. I understand that the practice of medicine is not an exact science and that Practitioner makes no warranties or guarantees regarding treatment outcomes. I acknowledge that a copy of this consent can be made available to me via my patient portal [name]), or I can request a printed copy by calling the office of [facility].    I understand that my insurance will be billed for this visit.   I have read or had this consent read to me. I understand the contents of this consent, which adequately explains the benefits and risks of the Services being provided via telemedicine.  I have been provided ample opportunity to ask questions regarding this consent and the Services and have had my questions answered to my satisfaction. I give my informed consent for the services to be provided through the use of telemedicine in my medical care

## 2023-11-29 DIAGNOSIS — I493 Ventricular premature depolarization: Secondary | ICD-10-CM | POA: Diagnosis not present

## 2023-12-11 ENCOUNTER — Ambulatory Visit (INDEPENDENT_AMBULATORY_CARE_PROVIDER_SITE_OTHER)

## 2023-12-11 DIAGNOSIS — Z01810 Encounter for preprocedural cardiovascular examination: Secondary | ICD-10-CM

## 2023-12-11 NOTE — Progress Notes (Signed)
 Message sent to [facility] sched to see where they can get pt in the office for appt.

## 2023-12-11 NOTE — Telephone Encounter (Signed)
 I s/w the pt per preop APP the pt needs in office appt. I reviewed schedules for Dr. Monetta. I did not see any open appts that did not have holds on them. I assured the pt that I am going to send a message to the scheduling team to call him with an appt for preop clearance.

## 2023-12-11 NOTE — Progress Notes (Signed)
   Virtual Visit via Telephone Note   Because of Jimmy Morrison co-morbid illnesses, he is at least at moderate risk for complications without adequate follow up.  This format is felt to be most appropriate for this patient at this time.  The patient did not have access to video technology/had technical difficulties with video requiring transitioning to audio format only (telephone).  All issues noted in this document were discussed and addressed.  No physical exam could be performed with this format.  Please refer to the patient's chart for his consent to telehealth for [facility].  Evaluation Performed:  Preoperative cardiovascular risk assessment _____________   Date:  12/11/2023   Patient ID:  Jimmy Morrison, DOB 18-Oct-1965, MRN [id number] Patient Location:  Home Provider location:   Office  Primary Care Provider:  Fernand Tracey LABOR, MD  Primary Cardiologist:  [facility] Providers Cardiologist:  Redell Leiter, MD    Patient reports he went in for his pre-op visit at [facility] and his EKG is was abnormal. His PCP has recommended he be evaluated by cardiology before undergoing surgery. He reports having occasional chest pain with features both similar and different from previous pain. He also has increased shortness of breath. Discussed no charging this visit and having patient contacted by scheduling to set up a visit. He is agreeable to this plan.    Barnie Hila, NP  12/11/2023, 11:58 AM

## 2023-12-12 NOTE — Progress Notes (Addendum)
 Cardiology Office Note:    Date:  12/12/2023   ID:  Jimmy Morrison, DOB [birth year], MRN [id number]  PCP:  Fernand Tracey LABOR, MD  Cardiologist:  Redell Leiter, MD    Referring MD: Fernand Tracey LABOR, MD    ASSESSMENT:    1. Preoperative cardiovascular examination   2. Coronary artery disease of native artery of native heart with stable angina pectoris   3. Primary hypertension   4. Mixed hyperlipidemia   5. OSA (obstructive sleep apnea)    PLAN:    In order of problems listed above:  From cardiac perspective his problems include stable CAD hypertension and sleep apnea His shortness of breath I suspect is more pulmonary than cardiac Further evaluation N-terminal proBNP level echocardiogram my office If no findings of heart failure or pulmonary hypertension or severe valve disease optimized from a cardiac perspective Perioperatively continue his usual cardiac medications including Zetia  fenofibrate  and rosuvastatin  antihypertensive valsartan and ranolazine  Placed on monitored bed first 24 to 40 hours EKG postoperative day 1 and consult my group to see him while he is in hospital with his underlying heart disease He will need attention to pulmonary toilet incentive spirometry postoperative oxygen saturations No PVCs present today I would not delay surgery for monitors etc.  01/12/2024: Echocardiogram normal ejection fraction no significant valvular heart disease proceed with his planned surgery without further cardiac evaluation he is optimized.  Next appointment: As routinely scheduled in 1 year   Medication Adjustments/Labs and Tests Ordered: Current medicines are reviewed at length with the patient today.  Concerns regarding medicines are outlined above.  No orders of the defined types were placed in this encounter.  No orders of the defined types were placed in this encounter.    History of Present Illness:    Jimmy Morrison is a 58 y.o. male with a hx of CAD hypertension  hyperlipidemia and obstructive sleep apnea last seen by me 10/25/2022.He had recent left catheterization 08/31/2022.  Coronary anatomy was unchanged with chronic total occlusion of proximal right coronary artery well collateralized and occlusion of the small second obtuse marginal artery and no significant stenosis remainder of his coronary vessels.  He was reviewed for the potential left CTO PCI was felt to be best treated medically.  Nuclear perfusion study showed ejection fraction 61% June 2024.  He is pending right total knee arthroplasty and has had multiple attempts for telemetry consultation failing to show.  Request for Surgical Clearance    Procedure:  Right total knee arthoplasty    Date of Surgery:  Clearance TBD                                  Surgeon:  Dr. Arley Dark Surgeon's Group or Practice Name:  [facility]  Phone number:  [phone] Fax number:  [phone]   Type of Clearance Requested:   - Pharmacy:  Hold Clopidogrel  (Plavix ) 5-7 days prior to surgery   Type of Anesthesia:  General   Compliance with diet, lifestyle and medications: Yes  He tells me he was seen by anesthesia at [facility] extra beat on his EKG told to see his family doctor see me as family doctor expressed hesitancy about him having total knee arthroplasty He complains of shortness of breath walking moderate distances but he smoked for 45 years a pack per day or greater Has a chronic cough chronic sputum and wheezes  at times And does not have edema orthopnea chest pain palpitation or syncope He thinks he had a chest x-ray done at [facility] I will see if I can access the report He says he has an EKG or strip in the chart Back in for me to look at it after he leaves The EKG shows the presence of sinus rhythm there are 2 PVCs present.  Chest x-ray shows no findings of heart failure questionable left lower lung opacity Past Medical History:   Diagnosis Date   Abnormal stress test 12/23/2020   Anxiety    Arthritis    Arthrodesis status 06/29/2020   Bipolar disorder (HCC)    Brachial neuritis 02/10/2009   Carpal tunnel syndrome    COPD (chronic obstructive pulmonary disease) (HCC)    Coronary artery disease    Degenerative disc disease, cervical 01/22/2012   Degenerative disc disease, lumbar 01/22/2012   Depression    Displacement of lumbar intervertebral disc 02/10/2009   Elevated blood-pressure reading, without diagnosis of hypertension 09/28/2020   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 ICD-10 cut over Formatting of this note might be different from the original. 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      EKGs/Labs/Other Studies Reviewed:    The following studies were reviewed today:  Cardiac Studies & Procedures   ______________________________________________________________________________________________ CARDIAC CATHETERIZATION  CARDIAC CATHETERIZATION 08/31/2022  Conclusion   Prox Cx lesion is 20% stenosed.   Mid Cx lesion is 20% stenosed.   Prox RCA to Mid RCA lesion is 100% stenosed.   1st Diag lesion is 50% stenosed.   2nd Mrg lesion is 100% stenosed.  1.  Relatively  unchanged burden of coronary artery disease with CTO of proximal right coronary artery collateralized by the left system and CTO small second obtuse marginal with mild disease elsewhere. 2.  LVEDP of 14 mmHg.   Recommendation: Medical management.  The results were reviewed with Dr. Jordan of the [facility] CTO PCI team whose opinion is that the right coronary artery is not amenable to CTO PCI.  Findings Coronary Findings Diagnostic  Dominance: Right  Left Anterior Descending  First Diagonal Branch 1st Diag lesion is 50% stenosed.  Left Circumflex Prox Cx lesion is 20% stenosed. Mid Cx lesion is 20% stenosed.  Second Obtuse Marginal Branch Collaterals 2nd Mrg filled by collaterals from 1st Diag.  2nd Mrg lesion is 100% stenosed.  Right Coronary Artery Prox RCA to Mid RCA lesion is 100% stenosed.  Acute Marginal Branch Collaterals Acute Mrg filled by collaterals from RV Branch.  Third Right Posterolateral Branch Collaterals 3rd RPL filled by collaterals from 1st Sept.  Intervention  No interventions have been documented.   CARDIAC CATHETERIZATION  CARDIAC CATHETERIZATION 12/24/2020  Conclusion   1st Diag lesion is 50%  stenosed.   Prox RCA to Mid RCA lesion is 100% stenosed.   Prox Cx lesion is 20% stenosed.   Mid Cx lesion is 20% stenosed.  Moderate multivessel CAD with with 50% stenosis in the first diagonal branch of the LAD with otherwise normal-appearing LAD; nonobstructive 20% stenoses in the circumflex vessel; and chronic appearing total occlusion of the proximal RCA with antegrade bridging collateralization as well as retrograde left to right collateralization to the distal vessel.  Normal LV contractility with EF estimated 50 to 55% without definitive segmental wall motion abnormalities.  LVEDP 10 mmHg.  RECOMMENDATION: Medical therapy.  Patient was bradycardic at [facility].  Consider adding isosorbide  and/or amlodipine  for anti-ischemic benefit.   Aggressive lipid-lowering therapy with target LDL less than 70.  Findings Coronary Findings Diagnostic  Dominance: Right  Left Anterior Descending  First Diagonal Branch 1st Diag lesion is 50% stenosed.  Left Circumflex Prox Cx lesion is 20% stenosed. Mid Cx lesion is 20% stenosed.  Right Coronary Artery Prox RCA to Mid RCA lesion is 100% stenosed.  Acute Marginal Branch Collaterals Acute Mrg filled by collaterals from RV Branch.  Third Right Posterolateral Branch Collaterals 3rd RPL filled by collaterals from 1st Sept.  Intervention  No interventions have been documented.   STRESS TESTS  MYOCARDIAL PERFUSION IMAGING 07/11/2022  Interpretation Summary   Findings are consistent with mild ischemia involving apical and mid portion of the inferior walland no infarction. The study is intermediate risk.   No ST deviation was noted.   Left ventricular function is normal. Nuclear stress EF: 61 %. The left ventricular ejection fraction is normal (55-65%). End diastolic cavity size is normal.   Prior study available for comparison from 12/22/2020.            ______________________________________________________________________________________________      EKG Interpretation Date/Time:  Thursday December 13 2023 14:56:24 EST Ventricular Rate:  60 PR Interval:  140 QRS Duration:  98 QT Interval:  428 QTC Calculation: 428 R Axis:   32  Text Interpretation: Normal sinus rhythm Normal ECG When compared with ECG of 31-Aug-2022 08:36, No significant change was found Confirmed by Monetta Rogue ([id number]) on 12/13/2023 2:58:01 PM     Recent Labs: 01/18/2023: ALT 8; BUN 9; Creatinine, Ser 1.19; Hemoglobin 15.4; Platelets 176; Potassium 4.0; Sodium 140  Recent Lipid Panel    Component Value Date/Time   CHOL 102 01/18/2023 1617   TRIG 119 01/18/2023 1617   HDL 43 01/18/2023 1617   CHOLHDL 2.4 01/18/2023 1617   CHOLHDL 4.0 12/24/2020 0128   VLDL 51 (H) 12/24/2020 0128    LDLCALC 38 01/18/2023 1617    Physical Exam:    VS:  There were no vitals taken for this visit.    Wt Readings from Last 3 Encounters:  06/14/23 216 lb (98 kg)  05/02/23 217 lb 3.2 oz (98.5 kg)  01/18/23 210 lb (95.3 kg)     GEN:  Well nourished, well developed in no acute distress HEENT: Normal NECK: No JVD; No carotid bruits LYMPHATICS: No lymphadenopathy CARDIAC: RRR, no murmurs, rubs, gallops RESPIRATORY: Hyperinflated diminished breath sounds prolonged expiration ABDOMEN: Soft, non-tender, non-distended MUSCULOSKELETAL:  No edema; No deformity  SKIN: Warm and dry NEUROLOGIC:  Alert and oriented x 3 PSYCHIATRIC:  Normal affect    Signed, Rogue Monetta, MD  12/12/2023 8:34 PM    [facility]

## 2023-12-12 NOTE — Telephone Encounter (Signed)
 Pt has in office appt with Dr. Monetta 12/13/23.

## 2023-12-13 ENCOUNTER — Encounter: Payer: Self-pay | Admitting: Cardiology

## 2023-12-13 ENCOUNTER — Ambulatory Visit: Attending: Cardiology | Admitting: Cardiology

## 2023-12-13 VITALS — BP 130/84 | HR 60 | Ht 69.0 in | Wt 209.8 lb

## 2023-12-13 DIAGNOSIS — R0602 Shortness of breath: Secondary | ICD-10-CM

## 2023-12-13 DIAGNOSIS — E782 Mixed hyperlipidemia: Secondary | ICD-10-CM

## 2023-12-13 DIAGNOSIS — I25118 Atherosclerotic heart disease of native coronary artery with other forms of angina pectoris: Secondary | ICD-10-CM

## 2023-12-13 DIAGNOSIS — G4733 Obstructive sleep apnea (adult) (pediatric): Secondary | ICD-10-CM

## 2023-12-13 DIAGNOSIS — I10 Essential (primary) hypertension: Secondary | ICD-10-CM | POA: Diagnosis not present

## 2023-12-13 DIAGNOSIS — Z01810 Encounter for preprocedural cardiovascular examination: Secondary | ICD-10-CM

## 2023-12-13 NOTE — Patient Instructions (Signed)
 Medication Instructions:  Your physician recommends that you continue on your current medications as directed. Please refer to the Current Medication list given to you today.  *If you need a refill on your cardiac medications before your next appointment, please call your pharmacy*   Lab Work: Your physician recommends that you have a proBNP today in the office.  If you have labs (blood work) drawn today and your tests are completely normal, you will receive your results only by: MyChart Message (if you have MyChart) OR A paper copy in the mail If you have any lab test that is abnormal or we need to change your treatment, we will call you to review the results.  Testing/Procedures: Your physician has requested that you have an echocardiogram. Echocardiography is a painless test that uses sound waves to create images of your heart. It provides your doctor with information about the size and shape of your heart and how well your heart's chambers and valves are working. This procedure takes approximately one hour. There are no restrictions for this procedure. Please do NOT wear cologne, perfume, aftershave, or lotions (deodorant is allowed). Please arrive 15 minutes prior to your appointment time.  Please note: We ask at that you not bring children with you during ultrasound (echo/ vascular) testing. Due to room size and safety concerns, children are not allowed in the ultrasound rooms during exams. Our front office staff cannot provide observation of children in our lobby area while testing is being conducted. An adult accompanying a patient to their appointment will only be allowed in the ultrasound room at the discretion of the ultrasound technician under special circumstances. We apologize for any inconvenience.  Follow-Up: At [facility], you and your health needs are our priority.  As part of our continuing mission to provide you with exceptional heart care, we have created designated  Provider Care Teams.  These Care Teams include your primary Cardiologist (physician) and Advanced Practice Providers (APPs -  Physician Assistants and Nurse Practitioners) who all work together to provide you with the care you need, when you need it.  We recommend signing up for the patient portal called MyChart.  Sign up information is provided on this After Visit Summary.  MyChart is used to connect with patients for Virtual Visits (Telemedicine).  Patients are able to view lab/test results, encounter notes, upcoming appointments, etc.  Non-urgent messages can be sent to your provider as well.   To learn more about what you can do with MyChart, go to [email or web address].    Your next appointment:   12 month(s)  The format for your next appointment:   In Person  Provider:   Redell Leiter, MD   Other Instructions Echocardiogram An echocardiogram is a test that uses sound waves (ultrasound) to produce images of the heart. Images from an echocardiogram can provide important information about: Heart size and shape. The size and thickness and movement of your heart's walls. Heart muscle function and strength. Heart valve function or if you have stenosis. Stenosis is when the heart valves are too narrow. If blood is flowing backward through the heart valves (regurgitation). A tumor or infectious growth around the heart valves. Areas of heart muscle that are not working well because of poor blood flow or injury from a heart attack. Aneurysm detection. An aneurysm is a weak or damaged part of an artery wall. The wall bulges out from the normal force of blood pumping through the body. Tell a health care provider about: Any  allergies you have. All medicines you are taking, including vitamins, herbs, eye drops, creams, and over-the-counter medicines. Any blood disorders you have. Any surgeries you have had. Any medical conditions you have. Whether you are pregnant or may be  pregnant. What are the risks? Generally, this is a safe test. However, problems may occur, including an allergic reaction to dye (contrast) that may be used during the test. What happens before the test? No specific preparation is needed. You may eat and drink normally. What happens during the test? You will take off your clothes from the waist up and put on a hospital gown. Electrodes or electrocardiogram (ECG)patches may be placed on your chest. The electrodes or patches are then connected to a device that monitors your heart rate and rhythm. You will lie down on a table for an ultrasound exam. A gel will be applied to your chest to help sound waves pass through your skin. A handheld device, called a transducer, will be pressed against your chest and moved over your heart. The transducer produces sound waves that travel to your heart and bounce back (or echo back) to the transducer. These sound waves will be captured in real-time and changed into images of your heart that can be viewed on a video monitor. The images will be recorded on a computer and reviewed by your health care provider. You may be asked to change positions or hold your breath for a short time. This makes it easier to get different views or better views of your heart. In some cases, you may receive contrast through an IV in one of your veins. This can improve the quality of the pictures from your heart. The procedure may vary among health care providers and hospitals.   What can I expect after the test? You may return to your normal, everyday life, including diet, activities, and medicines, unless your health care provider tells you not to do that. Follow these instructions at home: It is up to you to get the results of your test. Ask your health care provider, or the department that is doing the test, when your results will be ready. Keep all follow-up visits. This is important. Summary An echocardiogram is a test that uses  sound waves (ultrasound) to produce images of the heart. Images from an echocardiogram can provide important information about the size and shape of your heart, heart muscle function, heart valve function, and other possible heart problems. You do not need to do anything to prepare before this test. You may eat and drink normally. After the echocardiogram is completed, you may return to your normal, everyday life, unless your health care provider tells you not to do that. This information is not intended to replace advice given to you by your health care provider. Make sure you discuss any questions you have with your health care provider. Document Revised: 09/09/2019 Document Reviewed: 09/09/2019 Elsevier Patient Education  2021 Elsevier Inc.   Important Information About Sugar

## 2023-12-14 LAB — PRO B NATRIURETIC PEPTIDE: NT-Pro BNP: 219 pg/mL — ABNORMAL HIGH (ref 0–210)

## 2023-12-16 ENCOUNTER — Ambulatory Visit: Payer: Self-pay | Admitting: Cardiology

## 2023-12-18 NOTE — Telephone Encounter (Signed)
 I will forward to preop APP to review if the pt has been cleared.

## 2023-12-18 NOTE — Telephone Encounter (Signed)
 Forwarding message to Dr. Monetta for final clearance recommendations form his visit last week.    Per protocol, I will remove from the preop pool.

## 2023-12-18 NOTE — Telephone Encounter (Signed)
 Patient is returning call.

## 2023-12-18 NOTE — Telephone Encounter (Signed)
 Reena from [facility] is calling to follow up on the clearance status. Please advise.

## 2023-12-18 NOTE — Telephone Encounter (Signed)
 The requesting surgeon's office contacted our preop team this morning for an update on clearance.    When I reviewed your note on 12/13/23: From cardiac perspective his problems include stable CAD hypertension and sleep apnea His shortness of breath I suspect is more pulmonary than cardiac Further evaluation N-terminal proBNP level echocardiogram my office If no findings of heart failure or pulmonary hypertension or severe valve disease optimized from a cardiac perspective   Echo scheduled 01/09/24.  If you have already faxed your clearance note to the requesting office, please disregard. This request was sent back to our pool this morning from the surgeon's office. If the patient can proceed without an echo, we can let the office know.

## 2023-12-18 NOTE — Telephone Encounter (Signed)
 See notes from preop APP Angie Duke, PAC wo has reached out to Dr. Monetta. See notes.

## 2023-12-25 NOTE — Telephone Encounter (Signed)
 I am sending this back to preop APP to review. Please review if the pt has been cleared.

## 2024-01-09 ENCOUNTER — Ambulatory Visit: Attending: Cardiology

## 2024-01-09 DIAGNOSIS — R0602 Shortness of breath: Secondary | ICD-10-CM

## 2024-01-09 LAB — ECHOCARDIOGRAM COMPLETE
AR max vel: 3.3 cm2
AV Area VTI: 3.18 cm2
AV Area mean vel: 3.1 cm2
AV Mean grad: 2.8 mmHg
AV Peak grad: 5.1 mmHg
Ao pk vel: 1.13 m/s
Area-P 1/2: 1.86 cm2
MV VTI: 1.86 cm2
S' Lateral: 3 cm

## 2024-01-11 NOTE — Telephone Encounter (Signed)
 Dr Monetta,    Would you please comment on the echocardiogram completed on 01/09/2024 and your opinion about clearing the patient for surgery?  Thank you so much!!

## 2024-01-14 NOTE — Telephone Encounter (Signed)
° °  Patient Name: Jimmy Morrison  DOB: 04/08/1965 MRN: [id number]  Primary Cardiologist: Redell Leiter, MD  Chart reviewed as part of pre-operative protocol coverage. Given past medical history and time since last visit, based on ACC/AHA guidelines, JERRARD BRADBURN is at acceptable risk for the planned procedure without further cardiovascular testing.   Echocardiogram reviewed by Dr. Leiter 01/10/2024  Echocardiogram is good no findings of heart failure or heart reason for shortness of breath   Per office protocol, if patient is without any new symptoms or concerns at the time of their virtual visit, he/she may hold Plavix  for 5 days prior to procedure. Please resume Plavix  as soon as possible postprocedure, at the discretion of the surgeon.    The patient was advised that if he develops new symptoms prior to surgery to contact our office to arrange for a follow-up visit, and he verbalized understanding.  I will route this recommendation to the requesting party via Epic fax function and remove from pre-op pool.  Please call with questions.  Lamarr Satterfield, NP 01/14/2024, 7:40 AM

## 2024-01-19 ENCOUNTER — Other Ambulatory Visit: Payer: Self-pay | Admitting: Cardiology
